# Patient Record
Sex: Female | Born: 1970 | Race: White | Hispanic: No | Marital: Married | Smoking: Never smoker
Health system: Southern US, Community
[De-identification: ages and names within clinical notes are randomized; demographics above are authoritative.]

## PROBLEM LIST (undated history)

## (undated) DIAGNOSIS — K5792 Diverticulitis of intestine, part unspecified, without perforation or abscess without bleeding: Secondary | ICD-10-CM

## (undated) DIAGNOSIS — K219 Gastro-esophageal reflux disease without esophagitis: Secondary | ICD-10-CM

## (undated) DIAGNOSIS — E119 Type 2 diabetes mellitus without complications: Secondary | ICD-10-CM

## (undated) DIAGNOSIS — E7800 Pure hypercholesterolemia, unspecified: Secondary | ICD-10-CM

## (undated) HISTORY — DX: Type 2 diabetes mellitus without complications: E11.9

## (undated) HISTORY — DX: Pure hypercholesterolemia, unspecified: E78.00

## (undated) HISTORY — DX: Diverticulitis of intestine, part unspecified, without perforation or abscess without bleeding: K57.92

## (undated) HISTORY — DX: Gastro-esophageal reflux disease without esophagitis: K21.9

---

## 2000-01-17 ENCOUNTER — Other Ambulatory Visit: Admission: RE | Admit: 2000-01-17 | Discharge: 2000-01-17 | Payer: Self-pay | Admitting: Obstetrics and Gynecology

## 2000-08-09 ENCOUNTER — Encounter (INDEPENDENT_AMBULATORY_CARE_PROVIDER_SITE_OTHER): Payer: Self-pay

## 2000-08-09 ENCOUNTER — Inpatient Hospital Stay (HOSPITAL_COMMUNITY): Admission: AD | Admit: 2000-08-09 | Discharge: 2000-08-12 | Payer: Self-pay | Admitting: Obstetrics and Gynecology

## 2000-09-12 ENCOUNTER — Other Ambulatory Visit: Admission: RE | Admit: 2000-09-12 | Discharge: 2000-09-12 | Payer: Self-pay | Admitting: Obstetrics and Gynecology

## 2001-11-16 ENCOUNTER — Other Ambulatory Visit: Admission: RE | Admit: 2001-11-16 | Discharge: 2001-11-16 | Payer: Self-pay | Admitting: Obstetrics and Gynecology

## 2002-12-18 ENCOUNTER — Other Ambulatory Visit: Admission: RE | Admit: 2002-12-18 | Discharge: 2002-12-18 | Payer: Self-pay | Admitting: Obstetrics and Gynecology

## 2004-01-02 ENCOUNTER — Inpatient Hospital Stay (HOSPITAL_COMMUNITY): Admission: AD | Admit: 2004-01-02 | Discharge: 2004-01-07 | Payer: Self-pay | Admitting: Obstetrics and Gynecology

## 2004-01-03 ENCOUNTER — Encounter (INDEPENDENT_AMBULATORY_CARE_PROVIDER_SITE_OTHER): Payer: Self-pay | Admitting: Specialist

## 2004-02-11 ENCOUNTER — Other Ambulatory Visit: Admission: RE | Admit: 2004-02-11 | Discharge: 2004-02-11 | Payer: Self-pay | Admitting: Obstetrics and Gynecology

## 2005-04-18 ENCOUNTER — Other Ambulatory Visit: Admission: RE | Admit: 2005-04-18 | Discharge: 2005-04-18 | Payer: Self-pay | Admitting: Obstetrics and Gynecology

## 2005-09-03 ENCOUNTER — Emergency Department (HOSPITAL_COMMUNITY): Admission: EM | Admit: 2005-09-03 | Discharge: 2005-09-03 | Payer: Self-pay | Admitting: *Deleted

## 2009-08-20 ENCOUNTER — Emergency Department (HOSPITAL_COMMUNITY): Admission: EM | Admit: 2009-08-20 | Discharge: 2009-08-20 | Payer: Self-pay | Admitting: Emergency Medicine

## 2010-12-29 LAB — POCT PREGNANCY, URINE: Preg Test, Ur: NEGATIVE

## 2010-12-29 LAB — POCT CARDIAC MARKERS
CKMB, poc: <1 ng/mL — ABNORMAL LOW (ref 1.0–8.0)
Myoglobin, poc: 26.9 ng/mL (ref 12–200)
Myoglobin, poc: 39.8 ng/mL (ref 12–200)

## 2011-02-11 NOTE — Op Note (Signed)
NAMESHAKARA, TWEEDY                        ACCOUNT NO.:  [id number]   MEDICAL RECORD NO.:  [id number]                   PATIENT TYPE:  INP   LOCATION:  9171                                 FACILITY:  [facility]   PHYSICIAN:  Guy Sandifer. Arleta Creek, M.D.           DATE OF BIRTH:  July 12, 1971   DATE OF PROCEDURE:  01/03/2004  DATE OF DISCHARGE:                                 OPERATIVE REPORT   PREOPERATIVE DIAGNOSES:  1. Intrauterine pregnancy at 28 weeks' estimated gestational age.  2. Preeclampsia.  3. Arrest of cervical dilation.   POSTOPERATIVE DIAGNOSES:  1. Intrauterine pregnancy at 72 weeks' estimated gestational age.  2. Preeclampsia.  3. Arrest of cervical dilation.   PROCEDURE:  Low transverse cesarean section.   SURGEON:  Guy Sandifer. Henderson Cloud, M.D.   ANESTHESIA:  Epidural, Raul Del, M.D.   ESTIMATED BLOOD LOSS:  800 mL.   SPECIMENS:  Placenta.   FINDINGS:  A viable female infant, Apgars of 8 and 9 at one and five minutes,  respectively.  Birth weight of 7 pounds 10 ounces.  Arterial cord pH 7.17.   INDICATIONS AND CONSENT:  This patient is a 40 year old married white  female, G2, P1, who was found to have blood pressures on admission of  140s/90s.  She had tenderness in the epigastrium on examination.  Laboratories revealed marked elevation of liver functions in the [phone]  range as well as elevated kidney functions.  Platelets remained normal and  blood pressures remained stable.  She was placed on Pitocin augmentation and  was given magnesium sulfate.  She progressed to 4 cm dilation.  She had no  progress beyond that despite good labor.  A diagnosis of arrest of dilation  was made and recommendation for cesarean section was made.  Potential risks  and complications were reviewed preoperatively, including but not limited to  infection, bowel, bladder, or ureteral damage, bleeding requiring  transfusion of blood products with possible transfusion reaction, HIV  and  hepatitis acquisition, DVT, PE, and pneumonia. All questions were answered  and consent is signed on the chart.   PROCEDURE:  The patient is taken to the operating room, where she is  identified, epidural anesthetic is augmented to a surgical level, and she is  placed in the supine position with a 15 degree left lateral wedge.  A Foley  catheter is already in place.  She is prepped and draped in a sterile  fashion.  After testing for adequate epidural anesthesia, the skin is  entered through a Pfannenstiel incision and dissection is carried out in  layers to the peritoneum.  The peritoneum is incised and extended superiorly  and inferiorly.  The vesicouterine peritoneum is incised a bladder flap is  developed ,and the bladder blade is placed.  The uterus is incised in a low  transverse fashion and the uterine cavity is entered bluntly with a  hemostat.  The uterine incision is  then extended cephalolaterally with the  fingers.  The vertex was then delivered.  The oropharynx and nasopharynx  were suctioned, and the remainder of the baby is delivered.  The cord is  clamped and cut and the baby is handed to the awaiting pediatrics team.  The  placenta is manually delivered and sent to pathology.  The uterine cavity is  normal in contour and clean.  The uterus is closed in a running locking  fashion with 0 Monocryl suture, which achieves good hemostasis.  There is a  bleeder on the bladder flap, which is controlled with figure-of-eight  Monocryl sutures.  Tubes and ovaries palpate normally.  Copious irrigation  is carried out.  Anterior rectus fascia is closed in a running fashion with  0 Monocryl suture, which is also used to reapproximate the pyramidalis  muscle in the midline.  The anterior rectus fascia is closed in a running  fashion with 0 PDS suture and the skin is closed with clips.  All sponge,  instrument, and needle counts are correct and the patient is transferred to  the  recovery room in stable condition.  She will be continued on postpartum  magnesium sulfate.                                               Guy Sandifer Arleta Creek, M.D.    JET/MEDQ  D:  01/03/2004  T:  01/05/2004  Job:  [id number]

## 2011-02-11 NOTE — Discharge Summary (Signed)
Mary Conley, Mary Conley                        ACCOUNT NO.:  [id number]   MEDICAL RECORD NO.:  [id number]                   PATIENT TYPE:  INP   LOCATION:  9131                                 [facility]ACILITY:  [facility]   PHYSICIAN:  Duke Salvia. Marcelle Overlie, M.D.            DATE OF BIRTH:  12/17/1970   DATE OF ADMISSION:  01/02/2004  DATE OF DISCHARGE:  01/07/2004                                 DISCHARGE SUMMARY   ADMISSION DIAGNOSES:  1. Intrauterine pregnancy at term.  2. Pre-eclampsia.   DISCHARGE DIAGNOSES:  1. Status post low transverse cesarean section secondary to arrest of     cervical dilatation.  2. Viable female infant.   PROCEDURE:  Primary low transverse cesarean section.   REASON FOR ADMISSION:  See written history and physical.   HOSPITAL COURSE:  The patient was a 40 year old white married female gravida  2, para 1 that was noted to have elevated blood pressure on admission of  140/90. The patient had tenderness in epigastrium on examination. Labs did  reveal marked elevation of liver function studies as well as elevated kidney  function. Platelets remained normal and blood pressure's remained stable.  The patient was then started on Pitocin to augment her labor. She was also  administered magnesium sulfate. The patient did progress to 4 cm dilated.  She did not progress beyond that despite good labor. A diagnosis of arrest  of dilatation was made and recommendation for cesarean section was made. The  patient was then transferred to the operating room where epidural was dosed  to an adequate surgical level. A low transverse incision was made with the  delivery of a viable female infant weighing 7 pounds 10 ounces with Apgar's of  8 at 1 minute and 9 at 5 minutes. Umbilical cord pH was 7.17. The patient  tolerated the procedure well and was taken to the recovery room in stable  condition. Magnesium sulfate continued and the patient was then transferred  to the AICU where she could  be monitored closely. Later that evening, the  patient was without complaint except for itch and blood pressure was 120/70  to 80's. Urine output was good. Lungs were clear to auscultation. Deep  tendon reflexes were 3+ without clonus. Abdomen was soft with sluggish bowel  sounds. Abdominal dressing was clean, dry and intact. Labs revealed  hemoglobin of 11.4, white count of 18.9, platelet count of 260,000. SGOT was  947. SGPT was 791. Creatinine was 1.3 and LDH was 769. The patient continued  on magnesium sulfate on postoperative day 2. The patient was alert with good  relief of pain medication. Blood pressure's continued to be 90's to 100 over  50's. Urine output was 200 to 300 cc per hour. There were a few crackles  noted in the lower bases of her lungs. She was 98% oxygen saturated. Abdomen  was soft. Abdominal dressing continued to be clean, dry and intact. Deep  tendon reflexes were 2+. However, the patient continued to remain on 2 grams  of magnesium sulfate per hour. Platelets remained 234,000. SGOT was 376.  SGPT was 349. Creatinine remained at 1.3. Uric acid was 6.6. Later that  evening, the patient complained of being tired with upper airway congestion.  She had had some increased coughing and wheezing in the afternoon. The  patient had been administered Lasix 10 mg, which had improved. Blood  pressure's remained 100 to 120's over 50 to 60. She was afebrile. Continued  to hear some crackles in the left base with decreased breath sounds in the  right base. Oxygen saturation was 94 to 96% on 3 liters per minute. The  patient was experiencing some good effort on incentive spirometry. She had  been up in the chair earlier that day. Abdomen was soft. Some mild  distention noted. Bowel sounds were heard but they were somewhat decreased.  The patient denied headache, visual disturbances. Lasix 20 mg was now  administered. On postoperative day 2, the patient did feel better. She   continued to deny headache or visual disturbance. Blood pressure 110/60.  Vital signs otherwise were stable. Lungs were now clear. Abdomen soft.  Fundus was firm. Incision was clean, dry and intact. SGOT was down to 180,  SGPT down to 201. LDH was 298. Platelets remained good at 282,000. The  patient continued on magnesium sulfate. On postoperative day 3, the patient  complained of some nasal stuffiness with blood tinged yellow mucous. She  also complained of some congestion with a non-productive cough. She denied  headache or visual disturbance of right upper quadrant pain. Vital signs  were stable. Blood pressure 108/72. Deep tendon reflexes 1 to 2+. Abdomen  was soft with good return of bowel function. Fundus was firm and non-tender.  Incision was clean, dry and intact. She was ambulating well. She was now  started on some Ceftin 250 mg b.i.d. for upper respiratory infection. On  postoperative day 4, the patient was without complaint. Vital signs were  stable. The fundus was firm and non-tender. The incision was clean, dry and  intact. Staples were removed.   DISPOSITION:  Discharge instructions were reviewed and the patient was  discharged home.   CONDITION ON DISCHARGE:  Stable.   DIET:  Regular as tolerated.   ACTIVITY:  No heavy listing, no driving x2 weeks, no vaginal entry.   FOLLOW UP:  The patient is to followup in the office in 1 week for an  incision check. She is to call for a temperature greater than 100 degrees,  persistent nausea and vomiting, heavy vaginal bleeding, and/or redness or  drainage from the incisional site. The patient was also instructed to call  for headache, visual disturbance or right upper quadrant pain.   DISCHARGE MEDICATIONS:  1. Tylox #30 1 p.o. q. 4 to 6 hours p.r.n.  2. Ceftin 250 mg 1 p.o. b.i.d.  3. Prenatal vitamins 1 p.o. daily.  4. Colace 1 p.o. daily p.r.n.    Julio Sicks, N.P.                        Richard M. Marcelle Overlie, M.D.     CC/MEDQ  D:  03/07/2004  T:  03/07/2004  Job:  [id number]

## 2011-02-11 NOTE — Op Note (Signed)
[facility]  Patient:    Mary Conley, Mary Conley                     MRN: [id number] Proc. Date: 08/10/00 Adm. Date:  [id number] Attending:  Madelyn Flavors                           Operative Report  PROCEDURE:                    Vacuum extraction.  INDICATIONS AND CONSENT:      The patient is a 40 year old, married white female, G1, P0, EDC of August 08, 2000, at 42-2/7 weeks.  Prenatal care is uncomplicated.  Group B strep culture negative.  The patient complained of uterine contractions.  Spontaneous rupture of membranes revealed moderate meconium.  Progressed to complete and pushing.  Pushing for approximately two hours.  Vertex, LOA, +3 station.  Good movement of vertex with a push.  Fetal heart tones reactive with early decelerations.  Options discussed with patient.  Vacuum extraction discussed with patient and husband.  Severe morbidity mortality of 1:40,000 discussed.  All questions answered.  DESCRIPTION OF PROCEDURE:     Bladder straight catheterized.  Kiwi extractor placed, while one popoff before adequate suction had been applied on the handle pump.  On subsequent contraction easy delivery of the vertex.  Nuchal cord x 2 noted.  DeLee suction of oronasopharynx was done.  The infant was then delivered.  The cord is rapidly clamped and cut and the infant is handed to awaiting pediatrics team.  Placenta is delivered intact.  Three-vessel cord noted.  Placenta sent to pathology.  Estimated blood loss 500 cc.  Cervix without laceration.  Second degree midline episiotomy with left sulcus extension approximately 3 to 4 cm noted.  This is repaired in a standard fashion.  A viable female infant, Apgars of 6 and 9 at one and five minutes is obtained.  Birth weight pending.  Arterial cord pH 7.31.  The patient and infant stable in labor and delivery room. DD:  08/10/00 TD:  08/10/00 Job: [id number]

## 2012-02-07 ENCOUNTER — Encounter: Payer: Self-pay | Admitting: *Deleted

## 2012-12-06 ENCOUNTER — Other Ambulatory Visit: Payer: Self-pay | Admitting: Family Medicine

## 2012-12-06 DIAGNOSIS — M545 Low back pain: Secondary | ICD-10-CM

## 2012-12-12 ENCOUNTER — Other Ambulatory Visit: Payer: Self-pay | Admitting: Family Medicine

## 2012-12-12 DIAGNOSIS — M545 Low back pain: Secondary | ICD-10-CM

## 2012-12-13 ENCOUNTER — Ambulatory Visit (HOSPITAL_COMMUNITY)
Admission: RE | Admit: 2012-12-13 | Discharge: 2012-12-13 | Disposition: A | Discharge status: Home / Self Care | Payer: BC Managed Care – PPO | Source: Ambulatory Visit | Attending: Family Medicine | Admitting: Family Medicine

## 2012-12-13 DIAGNOSIS — M545 Low back pain, unspecified: Secondary | ICD-10-CM | POA: Insufficient documentation

## 2012-12-24 ENCOUNTER — Encounter: Payer: Self-pay | Admitting: Family Medicine

## 2013-01-03 ENCOUNTER — Encounter: Payer: BC Managed Care – PPO | Admitting: Neurology

## 2013-09-17 ENCOUNTER — Ambulatory Visit (INDEPENDENT_AMBULATORY_CARE_PROVIDER_SITE_OTHER): Payer: BC Managed Care – PPO | Admitting: Family Medicine

## 2013-09-17 ENCOUNTER — Encounter: Payer: Self-pay | Admitting: Family Medicine

## 2013-09-17 VITALS — BP 120/72 | HR 76 | Temp 97.3°F | Resp 18 | Wt 225.0 lb

## 2013-09-17 DIAGNOSIS — J019 Acute sinusitis, unspecified: Secondary | ICD-10-CM

## 2013-09-17 MED ORDER — AMOXICILLIN-POT CLAVULANATE 875-125 MG PO TABS: 1.0000 | ORAL_TABLET | Freq: Two times a day (BID) | ORAL | Status: DC

## 2013-09-17 NOTE — Progress Notes (Signed)
   Subjective:    Patient ID: Mary Conley, female    DOB: November 10, 1970, 42 y.o.   MRN: [id number]  HPI Patient had an upper respiratory infection for the last 3 weeks. His characterized by sinus congestion, sinus pressure, postnasal drip, sore scratchy throat, nonproductive cough and subjective fevers. She reports headache and sinus pressure. She describes the cough as a tickle in the back of her throat.  She also reports pain in her upper teeth. She's tried over-the-counter medication for the last 3 weeks without benefit. Past Medical History  Diagnosis Date  . Chest pain   . GERD (gastroesophageal reflux disease)   . Hypercholesteremia    No current outpatient prescriptions on file prior to visit.   No current facility-administered medications on file prior to visit.   No Known Allergies History   Social History  . Marital Status: Married    Spouse Name: N/A    Number of Children: 2  . Years of Education: N/A   Occupational History  . teacher / coach    Social History Main Topics  . Smoking status: Never Smoker   . Smokeless tobacco: Not on file  . Alcohol Use: No  . Drug Use: Not on file  . Sexual Activity: Not on file   Other Topics Concern  . Not on file   Social History Narrative  . No narrative on file      Review of Systems  All other systems reviewed and are negative.       Objective:   Physical Exam  Vitals reviewed. Constitutional: She appears well-developed and well-nourished. No distress.  HENT:  Right Ear: External ear normal.  Left Ear: External ear normal.  Nose: Mucosal edema and rhinorrhea present. Right sinus exhibits maxillary sinus tenderness. Left sinus exhibits maxillary sinus tenderness.  Mouth/Throat: Oropharynx is clear and moist. No oropharyngeal exudate.  Eyes: Conjunctivae are normal.  Neck: Neck supple.  Cardiovascular: Normal rate, regular rhythm and normal heart sounds.   Pulmonary/Chest: Effort normal and breath sounds  normal. No respiratory distress. She has no wheezes. She has no rales.  Lymphadenopathy:    She has no cervical adenopathy.  Skin: She is not diaphoretic.          Assessment & Plan:  1. Acute rhinosinusitis Patient has originally sounds like a viral upper respiratory infection which has subsequently turned into a secondary bacterial sinusitis. Begin Augmentin 875 mg by mouth twice a day for 10 days. Begin Sudafed 60 mg by mouth every 4 hours as needed for congestion. Begin nasal saline 4 times a day. Recheck in one week if no better or sooner if worse. - amoxicillin-clavulanate (AUGMENTIN) 875-125 MG per tablet; Take 1 tablet by mouth 2 (two) times daily.  Dispense: 20 tablet; Refill: 0

## 2013-09-24 ENCOUNTER — Telehealth: Payer: Self-pay | Admitting: Family Medicine

## 2013-09-24 NOTE — Telephone Encounter (Signed)
Pt is calling to see if she needs to have an apt with dr pickard or can she just come in to do her blood work to check her vitamin D that she was suppose to have already done. Also she wants to know if she can pee in a cup to see if she has a UTI Call back number is [phone]

## 2013-09-25 NOTE — Telephone Encounter (Signed)
Pt will need an appt and due to office being closed pt will need to been seen at [facility] for UTI.  Pt aware per vm.

## 2013-09-27 ENCOUNTER — Other Ambulatory Visit: Payer: BC Managed Care – PPO

## 2013-09-27 DIAGNOSIS — E559 Vitamin D deficiency, unspecified: Secondary | ICD-10-CM

## 2013-09-28 LAB — VITAMIN D 25 HYDROXY (VIT D DEFICIENCY, FRACTURES): VIT D 25 HYDROXY: 33 ng/mL (ref 30–89)

## 2013-09-30 ENCOUNTER — Encounter: Payer: Self-pay | Admitting: Family Medicine

## 2013-09-30 NOTE — Telephone Encounter (Signed)
Pt is calling to see about the results of her blood work Call back number is [phone]

## 2013-10-04 NOTE — Telephone Encounter (Signed)
This encounter was created in error - please disregard.

## 2014-05-06 ENCOUNTER — Encounter: Payer: Self-pay | Admitting: Family Medicine

## 2014-05-06 ENCOUNTER — Ambulatory Visit (INDEPENDENT_AMBULATORY_CARE_PROVIDER_SITE_OTHER): Payer: BC Managed Care – PPO | Admitting: Family Medicine

## 2014-05-06 VITALS — BP 120/72 | HR 80 | Resp 18 | Wt 228.0 lb

## 2014-05-06 DIAGNOSIS — R300 Dysuria: Secondary | ICD-10-CM

## 2014-05-06 DIAGNOSIS — M545 Low back pain, unspecified: Secondary | ICD-10-CM

## 2014-05-06 DIAGNOSIS — R739 Hyperglycemia, unspecified: Secondary | ICD-10-CM

## 2014-05-06 DIAGNOSIS — R7309 Other abnormal glucose: Secondary | ICD-10-CM

## 2014-05-06 LAB — URINALYSIS, ROUTINE W REFLEX MICROSCOPIC
Bilirubin Urine: NEGATIVE
Glucose, UA: NEGATIVE mg/dL
Ketones, ur: NEGATIVE mg/dL
NITRITE: NEGATIVE
Protein, ur: NEGATIVE mg/dL
SPECIFIC GRAVITY, URINE: 1.025 (ref 1.005–1.030)
UROBILINOGEN UA: 0.2 mg/dL (ref 0.0–1.0)
pH: 5 (ref 5.0–8.0)

## 2014-05-06 LAB — COMPLETE METABOLIC PANEL WITH GFR
ALBUMIN: 4 g/dL (ref 3.5–5.2)
ALK PHOS: 39 U/L (ref 39–117)
ALT: 15 U/L (ref 0–35)
AST: 13 U/L (ref 0–37)
BUN: 9 mg/dL (ref 6–23)
CO2: 25 mEq/L (ref 19–32)
CREATININE: 0.66 mg/dL (ref 0.50–1.10)
Calcium: 8.7 mg/dL (ref 8.4–10.5)
Chloride: 101 mEq/L (ref 96–112)
GFR, Est African American: >89 mL/min
GFR, Est Non African American: >89 mL/min
Glucose, Bld: 110 mg/dL — ABNORMAL HIGH (ref 70–99)
POTASSIUM: 4.1 meq/L (ref 3.5–5.3)
Sodium: 135 mEq/L (ref 135–145)
Total Bilirubin: 0.4 mg/dL (ref 0.2–1.2)
Total Protein: 6.8 g/dL (ref 6.0–8.3)

## 2014-05-06 LAB — URINALYSIS, MICROSCOPIC ONLY
CRYSTALS: NONE SEEN
Casts: NONE SEEN

## 2014-05-06 LAB — HEMOGLOBIN A1C
HEMOGLOBIN A1C: 5.9 % — AB (ref <5.7)
MEAN PLASMA GLUCOSE: 123 mg/dL — AB (ref <117)

## 2014-05-06 MED ORDER — CIPROFLOXACIN HCL 500 MG PO TABS: 500.0000 mg | ORAL_TABLET | Freq: Two times a day (BID) | ORAL | Status: DC

## 2014-05-06 NOTE — Addendum Note (Signed)
Addended by: WRAY, [place] on: 05/06/2014 10:30 AM   Modules accepted: Orders

## 2014-05-06 NOTE — Progress Notes (Signed)
   Subjective:    Patient ID: Mary Conley, female    DOB: January 07, 1971, 43 y.o.   MRN: [id number]  HPI I saw the patient for low back pain in March 2014. At that time, I obtained a lumbar spine x-ray. The results are as follows: Findings: Mild levoscoliosis lumbar spine. No significant disc  space narrowing. No pars defect.   I recommended physical therapy and recheck in one month.  Patient was doing well over the last year with no complaints. Recently 2 weeks ago, she developed low back pain located over the sacrum. There is no radiation of the pain down her legs her upper spine. She denies any lumbar radiculopathy. She also complains that dysuria, cloudy foul-smelling urine, and pelvic pressure and discomfort. This has improved and she started drinking cranberry juice. She denies any fevers or chills or nausea or vomiting. She does report fasting blood sugars between 110 and 126. She is drinking sodas and cheese every day. She is not adhering to a low carb diet. Past Medical History  Diagnosis Date  . Chest pain   . GERD (gastroesophageal reflux disease)   . Hypercholesteremia    No current outpatient prescriptions on file prior to visit.   No current facility-administered medications on file prior to visit.   No Known Allergies History   Social History  . Marital Status: Married    Spouse Name: N/A    Number of Children: 2  . Years of Education: N/A   Occupational History  . teacher / coach    Social History Main Topics  . Smoking status: Never Smoker   . Smokeless tobacco: Not on file  . Alcohol Use: No  . Drug Use: Not on file  . Sexual Activity: Not on file   Other Topics Concern  . Not on file   Social History Narrative  . No narrative on file      Review of Systems  All other systems reviewed and are negative.      Objective:   Physical Exam  Vitals reviewed. Cardiovascular: Normal rate, regular rhythm and normal heart sounds.   Pulmonary/Chest:  Effort normal and breath sounds normal.  Abdominal: Soft. Bowel sounds are normal. She exhibits no distension. There is no tenderness. There is no rebound and no guarding.  Musculoskeletal:       Lumbar back: She exhibits pain. She exhibits normal range of motion, no bony tenderness and no spasm.          Assessment & Plan:  1. Bilateral low back pain without sciatica With the back pain is likely muscular in nature. I did not believe it is related to urinary tract infection. I recommended tincture of time in the next one to 2 weeks to see if symptoms improve. 2. Hyperglycemia The patient is developing prediabetes. I will check hemoglobin A1c. I recommended abstinence from soda and juice. I recommended a low carbohydrate diet, regular aerobic exercise, and 10:15 pounds weight - COMPLETE METABOLIC PANEL WITH GFR - Hemoglobin A1c  3. Dysuria Urinalysis is complicated by the fact that patient is on her period. However there is leukocyte esterase and the patient is having symptoms consistent with a bladder infection therefore I will treat her with Cipro 500 mg by mouth twice a day for 3 days. - Urinalysis, Routine w reflex microscopic - ciprofloxacin (CIPRO) 500 MG tablet; Take 1 tablet (500 mg total) by mouth 2 (two) times daily.  Dispense: 6 tablet; Refill: 0

## 2014-05-07 LAB — URINE CULTURE
Colony Count: NO GROWTH
ORGANISM ID, BACTERIA: NO GROWTH

## 2014-05-08 ENCOUNTER — Telehealth: Payer: Self-pay | Admitting: Family Medicine

## 2014-05-08 NOTE — Telephone Encounter (Signed)
Patient is calling to get her lab results  [phone]

## 2014-05-09 NOTE — Telephone Encounter (Signed)
Call placed to patient and patient made aware.  

## 2014-06-24 ENCOUNTER — Other Ambulatory Visit: Payer: Self-pay | Admitting: Obstetrics and Gynecology

## 2014-09-04 ENCOUNTER — Other Ambulatory Visit: Payer: Self-pay | Admitting: Obstetrics and Gynecology

## 2014-10-23 ENCOUNTER — Encounter: Payer: Self-pay | Admitting: Physician Assistant

## 2014-10-23 ENCOUNTER — Ambulatory Visit (INDEPENDENT_AMBULATORY_CARE_PROVIDER_SITE_OTHER): Payer: BC Managed Care – PPO | Admitting: Physician Assistant

## 2014-10-23 VITALS — BP 114/60 | HR 68 | Temp 98.1°F | Resp 20 | Wt 226.0 lb

## 2014-10-23 DIAGNOSIS — L239 Allergic contact dermatitis, unspecified cause: Secondary | ICD-10-CM

## 2014-10-23 DIAGNOSIS — L200 Besnier's prurigo: Secondary | ICD-10-CM

## 2014-10-23 MED ORDER — PREDNISONE 20 MG PO TABS
ORAL_TABLET | ORAL | Status: DC
Start: 2014-10-23 — End: 2015-03-31

## 2014-10-23 NOTE — Progress Notes (Signed)
    Patient ID: Mary Conley MRN: [id number], DOB: [birth year], 44 y.o. Date of Encounter: 10/23/2014, 4:55 PM    Chief Complaint:  Chief Complaint  Patient presents with  . rash    back of arms, thighs, feet     HPI: 44 y.o. year old white female says that itching and rash started on the night of Tuesday 10/21/14. Says at that time started on the backs of her upper arms on the right arm then to the left arm. Since then she has also developed some itchy rash on her thighs bilaterally. Also her feet feel very itchy but there is only a small amount of mottled rash around the edges of her feet. No itching or rash on the chest or abdomen or back. Says that she has had no change in detergents or lotions soaps etc. cannot think of anything she has come in contact with.     Home Meds:   Outpatient Prescriptions Prior to Visit  Medication Sig Dispense Refill  . Probiotic Product (PROBIOTIC PO) Take by mouth. Takes over the counter    . ciprofloxacin (CIPRO) 500 MG tablet Take 1 tablet (500 mg total) by mouth 2 (two) times daily. 6 tablet 0   No facility-administered medications prior to visit.    Allergies: No Known Allergies    Review of Systems: See HPI for pertinent ROS. All other ROS negative.    Physical Exam: Blood pressure 114/60, pulse 68, temperature 98.1 F (36.7 C), temperature source Oral, resp. rate 20, weight 226 lb (102.513 kg)., There is no height on file to calculate BMI. General:  Obese white female . Appears in no acute distress. Neck: Supple. No thyromegaly. No lymphadenopathy. Lungs: Clear bilaterally to auscultation without wheezes, rales, or rhonchi. Breathing is unlabored. Heart: Regular rhythm. No murmurs, rubs, or gallops. Msk:  Strength and tone normal for age. Extremities/Skin: Very small, tiny pink papules on upper arms bilaterally and on anterior thighs bilaterally. Very light pink mottled macular rash around periphery of feet.  Neuro: Alert and  oriented X 3. Moves all extremities spontaneously. Gait is normal. CNII-XII grossly in tact. Psych:  Responds to questions appropriately with a normal affect.     ASSESSMENT AND PLAN:  44 y.o. year old female with  1. Allergic dermatitis - predniSONE (DELTASONE) 20 MG tablet; Take 3 daily for 2 days, then 2 daily for 2 days, then 1 daily for 2 days.  Dispense: 12 tablet; Refill: 0 She is to take prednisone taper as directed. She says the Benadryl makes her very drowsy but she is to take this at night. Can apply hydrocortisone cream for itching until the prednisone begins to take effect. Follow-up if itching and rash do not resolve over the next few days or at completion of prednisone.  [address] [place], [place], [facility] 10/23/2014 4:55 PM

## 2015-01-19 ENCOUNTER — Telehealth: Payer: Self-pay | Admitting: Family Medicine

## 2015-01-19 NOTE — Telephone Encounter (Signed)
Patient is calling to see if dr pickard will refill her Vitamin d pill.  [phone]

## 2015-01-20 NOTE — Telephone Encounter (Signed)
LMTRC

## 2015-01-28 NOTE — Telephone Encounter (Signed)
LMTRC

## 2015-02-04 NOTE — Telephone Encounter (Signed)
Last Vit D was low normal and should be taking OTC vit D 1000 units qd. Would need ov and labs to be restarted on vit d 5000. Unable to reach pt by phone.

## 2015-03-25 ENCOUNTER — Telehealth: Payer: Self-pay | Admitting: Family Medicine

## 2015-03-25 NOTE — Telephone Encounter (Signed)
Okay to send Vitamin D 50,000 IU x 12 weeks, then needs 1000IU daily Her A1C is trending up, she needs to work on weight loss and nutrition, see if willing to go to nutritionist. I would recheck in 3 months if not improved  May need to start Metformin

## 2015-03-25 NOTE — Telephone Encounter (Signed)
Pt brought labs in that were done at her workplace and her Vitamin D was low - 18.4 (lab work is on my desk) and she is wondering if she needs to take the 50,000 U of vit D again?? Her BS was also a little high at 128 fasting and A1C was 6.3.

## 2015-03-31 ENCOUNTER — Ambulatory Visit (INDEPENDENT_AMBULATORY_CARE_PROVIDER_SITE_OTHER): Payer: BC Managed Care – PPO | Admitting: Family Medicine

## 2015-03-31 ENCOUNTER — Ambulatory Visit (HOSPITAL_COMMUNITY)
Admission: RE | Admit: 2015-03-31 | Discharge: 2015-03-31 | Disposition: A | Discharge status: Home / Self Care | Payer: BC Managed Care – PPO | Source: Ambulatory Visit | Attending: Family Medicine | Admitting: Family Medicine

## 2015-03-31 ENCOUNTER — Encounter: Payer: Self-pay | Admitting: Family Medicine

## 2015-03-31 VITALS — BP 118/64 | HR 78 | Temp 98.7°F | Resp 18 | Wt 225.0 lb

## 2015-03-31 DIAGNOSIS — R7309 Other abnormal glucose: Secondary | ICD-10-CM | POA: Diagnosis not present

## 2015-03-31 DIAGNOSIS — G8929 Other chronic pain: Secondary | ICD-10-CM

## 2015-03-31 DIAGNOSIS — E65 Localized adiposity: Secondary | ICD-10-CM

## 2015-03-31 DIAGNOSIS — M542 Cervicalgia: Secondary | ICD-10-CM

## 2015-03-31 DIAGNOSIS — R7303 Prediabetes: Secondary | ICD-10-CM

## 2015-03-31 MED ORDER — VITAMIN D (ERGOCALCIFEROL) 1.25 MG (50000 UNIT) PO CAPS
50000.0000 [IU] | ORAL_CAPSULE | ORAL | Status: DC
Start: 2015-03-31 — End: 2018-05-07

## 2015-03-31 NOTE — Telephone Encounter (Signed)
Pt aware and will work on diet and exercise as she has started a new program and f/u in 3 months - Med sent to [employer]

## 2015-03-31 NOTE — Progress Notes (Signed)
Subjective:    Patient ID: Mary Conley, female    DOB: 12/16/1970, 44 y.o.   MRN: [id number]  HPI Patient recently had lab work in [facility] revealed a fasting blood sugar of 128 and hemoglobin A1c of 6.3 consistent with prediabetes. She also has obesity and a large waist circumference. She is borderline metabolic syndrome although she does not reach lipid guidelines normal blood pressure guidelines. She is concerned because of some chronic pain in her neck as well as a buffalo hump that is developed around the level of C7. Certainly she has physical exam findings concerning for Cushing syndrome. However I believe that this is just a fat pad on her posterior neck likely related to her metabolic syndrome. She also complains of pain and stiffness in her hands in the morning and improves throughout the day. Her hands also occasionally go numb at night distal to the wrist but they improve with position changes and extending her wrist. She does a lot of typing at work Past Medical History  Diagnosis Date  . Chest pain   . GERD (gastroesophageal reflux disease)   . Hypercholesteremia    Past Surgical History  Procedure Laterality Date  . Cesarean section     Current Outpatient Prescriptions on File Prior to Visit  Medication Sig Dispense Refill  . Vitamin D, Ergocalciferol, (DRISDOL) 50000 UNITS CAPS capsule Take 1 capsule (50,000 Units total) by mouth every 7 (seven) days. 4 capsule 3   No current facility-administered medications on file prior to visit.   No Known Allergies History   Social History  . Marital Status: Married    Spouse Name: N/A  . Number of Children: 2  . Years of Education: N/A   Occupational History  . teacher / coach    Social History Main Topics  . Smoking status: Never Smoker   . Smokeless tobacco: Not on file  . Alcohol Use: No  . Drug Use: Not on file  . Sexual Activity: Not on file   Other Topics Concern  . Not on file   Social History  Narrative      Review of Systems  All other systems reviewed and are negative.      Objective:   Physical Exam  HENT:  Mouth/Throat: No oropharyngeal exudate.  Neck: Neck supple. No JVD present. No thyromegaly present.  Cardiovascular: Normal rate, regular rhythm and normal heart sounds.   Pulmonary/Chest: Effort normal and breath sounds normal.  Musculoskeletal:       Cervical back: She exhibits decreased range of motion, tenderness, deformity and pain. She exhibits no bony tenderness.       Right hand: Normal.       Left hand: Normal.  Lymphadenopathy:    She has no cervical adenopathy.  Vitals reviewed. Patient has a small buffalo hump over the C7 spinous process        Assessment & Plan:  Chronic neck pain - Plan: DG Cervical Spine Complete  Buffalo hump - Plan: Cortisol-pm, blood  Prediabetes  Patient has prediabetes. She is also borderline for metabolic syndrome. I believe the buffalo hump is likely just deposition of fat around the spinous processes of the cervical spine. However I will check a cortisol level to rule out Cushing's disease particularly given her prediabetes. I will also obtain an x-ray of the neck given the pain she is having the neck. I believe the pain and stiffness in her neck is likely due to muscle spasms. I recommended stretching  and massages to help treat this. I do believe she is developing some early arthritis in her hands to account for the stiffness in the morning. Also believe she may have some mild carpal tunnel syndrome causing the numbness at night. If cortisol level is normal and the x-ray of the neck is normal, I have recommended diet exercise and weight loss as a means to manage her prediabetes. Also recommended stretching, massages, and exercises treat the pain in her neck. I anticipate that the buffalo hump likely regress with weight loss. Certainly if the cortisol level is elevated, I will refer the patient to an endocrinologist. If  there is evidence of degenerative disc disease in the neck, I will treat the patient with anti-inflammatory.

## 2015-04-01 LAB — CORTISOL-PM, BLOOD: Cortisol - PM: 18.5 ug/dL — ABNORMAL HIGH (ref 3.1–16.7)

## 2015-04-02 ENCOUNTER — Other Ambulatory Visit: Payer: Self-pay | Admitting: Family Medicine

## 2015-04-02 DIAGNOSIS — E270 Other adrenocortical overactivity: Principal | ICD-10-CM

## 2015-04-02 DIAGNOSIS — R7989 Other specified abnormal findings of blood chemistry: Secondary | ICD-10-CM

## 2015-04-03 ENCOUNTER — Other Ambulatory Visit: Payer: BC Managed Care – PPO

## 2015-04-03 DIAGNOSIS — R7989 Other specified abnormal findings of blood chemistry: Secondary | ICD-10-CM

## 2015-04-03 DIAGNOSIS — E270 Other adrenocortical overactivity: Principal | ICD-10-CM

## 2015-04-07 LAB — CORTISOL, URINE, 24 HOUR
CORTISOL (UR), FREE: 11.9 ug/(24.h) (ref 4.0–50.0)
RESULTS RECEIVED: 1.39 g/(24.h) (ref 0.63–2.50)

## 2015-07-01 ENCOUNTER — Encounter: Payer: Self-pay | Admitting: Physician Assistant

## 2015-07-01 ENCOUNTER — Ambulatory Visit (INDEPENDENT_AMBULATORY_CARE_PROVIDER_SITE_OTHER): Payer: BC Managed Care – PPO | Admitting: Physician Assistant

## 2015-07-01 VITALS — BP 114/80 | HR 88 | Temp 98.6°F | Resp 20 | Wt 226.0 lb

## 2015-07-01 DIAGNOSIS — M25561 Pain in right knee: Secondary | ICD-10-CM | POA: Diagnosis not present

## 2015-07-01 DIAGNOSIS — J392 Other diseases of pharynx: Secondary | ICD-10-CM | POA: Diagnosis not present

## 2015-07-01 DIAGNOSIS — Z23 Encounter for immunization: Secondary | ICD-10-CM

## 2015-07-01 DIAGNOSIS — M545 Low back pain, unspecified: Secondary | ICD-10-CM

## 2015-07-01 DIAGNOSIS — R300 Dysuria: Secondary | ICD-10-CM

## 2015-07-01 LAB — URINALYSIS, ROUTINE W REFLEX MICROSCOPIC
BILIRUBIN URINE: NEGATIVE
GLUCOSE, UA: NEGATIVE
HGB URINE DIPSTICK: NEGATIVE
Ketones, ur: NEGATIVE
Leukocytes, UA: NEGATIVE
Nitrite: NEGATIVE
PROTEIN: NEGATIVE
Specific Gravity, Urine: 1.025 (ref 1.001–1.035)
pH: 6.5 (ref 5.0–8.0)

## 2015-07-01 NOTE — Progress Notes (Signed)
Patient ID: GRAINNE KNIGHTS MRN: [id number], DOB: 1971/06/22, 44 y.o. Date of Encounter: 07/01/2015, 1:19 PM    Chief Complaint:  Chief Complaint  Patient presents with  . c/o lower back flank pain    urinary leakage  . feels like something stuck in throat     HPI: 44 y.o. year old white female presents with the following issues:  Says that yesterday afternoon she felt some discomfort in her left low back. States that last night when sleeping, when she was in certain positions she felt uncomfortable in her left low back, but then with other position changes back feel better without that discomfort there. Wasn't sure if this was muscular or may be from a urine infections wanted to check for urine infection.  States that Saturday 06/20/15 which was 10 days ago, she was eating some kettle corn. Felt a kernel of corn get stuck in her throat. Says that she started feeling this sensation right then and has continued to feel this sensation since then. says that she has tried drinking lots of fluid but that does not get rid of it. Says she has even stuck her toothbrush to the back of her throat to try to get herself to gag--was hoping that would help, but it hasn't. Says that she does not have a "sore throat "and has no rhinorrhea or mucus from her nose to suggest any issues with nasal drainage. Does not think that this is coming from irritation secondary to postnasal drip. Says that she still is feeling exact same discomfort that she felt when eating that kettle corn and sensation has been there ever since the kettle corn.  Says she has also been having problems with her right knee recently. Says that when it started, she was only feeling it sometimes at night. Says that in the middle of the night she would feel pain in her right knee. At that time the knee would be bent and when she would try to stretch it out to straighten it, it would hurt really bad while trying to straighten it. However when she  got it straightened out,  then the knee would feel okay. Was only feeling this at night and was having no problems with her knee during the day until recently. Now over the past week even during the day, sometimes the right knee 'feels like it is burning inside". Has done no unusual activity. Has had no injury or trauma. Knee has not felt like it was giving way or locking up.     Home Meds:   Outpatient Prescriptions Prior to Visit  Medication Sig Dispense Refill  . Vitamin D, Ergocalciferol, (DRISDOL) 50000 UNITS CAPS capsule Take 1 capsule (50,000 Units total) by mouth every 7 (seven) days. 4 capsule 3   No facility-administered medications prior to visit.    Allergies: No Known Allergies    Review of Systems: See HPI for pertinent ROS. All other ROS negative.    Physical Exam: Blood pressure 114/80, pulse 88, temperature 98.6 F (37 C), temperature source Oral, resp. rate 20, weight 226 lb (102.513 kg)., There is no height on file to calculate BMI. General:  Obese WF. Appears in no acute distress. HEENT: Normocephalic, atraumatic, eyes without discharge, sclera non-icteric, nares are without discharge. Oral cavity moist, posterior pharynx without exudate, erythema, peritonsillar abscess, or post nasal drip. No foreign object visualized in throat. No lesion, erythema, etc visulaized in throat.   Neck: Supple. No thyromegaly. No lymphadenopathy. Lungs: Clear  bilaterally to auscultation without wheezes, rales, or rhonchi. Breathing is unlabored. Heart: Regular rhythm. No murmurs, rubs, or gallops. Msk:  Strength and tone normal for age. Left low back: Minimal tenderness with palpation of area approximate L1-L3. No tenderness with percussion of costophrenic angles bilaterally. Right knee: Inspection is normal. With palpation there is tenderness with palpation along the medial joint line. No tenderness with palpation around the patella or over the lateral joint line. Grind test  negative. Extremities/Skin: Warm and dry. Neuro: Alert and oriented X 3. Moves all extremities spontaneously. Gait is normal. CNII-XII grossly in tact. Psych:  Responds to questions appropriately with a normal affect.   Results for orders placed or performed in visit on 07/01/15  Urinalysis, Routine w reflex microscopic (not at [facility])  Result Value Ref Range   Color, Urine YELLOW YELLOW   APPearance CLEAR CLEAR   Specific Gravity, Urine 1.025 1.001 - 1.035   pH 6.5 5.0 - 8.0   Glucose, UA NEGATIVE NEGATIVE   Bilirubin Urine NEGATIVE NEGATIVE   Ketones, ur NEGATIVE NEGATIVE   Hgb urine dipstick NEGATIVE NEGATIVE   Protein, ur NEGATIVE NEGATIVE   Nitrite NEGATIVE NEGATIVE   Leukocytes, UA NEGATIVE NEGATIVE     ASSESSMENT AND PLAN:  44 y.o. year old female with  1. Left-sided low back pain without sciatica Urinalysis is normal and does not indicate UTI. Discussed this with patient and discussed that her back pain sounds consistent with muscular back pain. Offered prescription for muscle relaxer but she defers. She continues over-the-counter NSAIDs with food as needed. Also recommend gentle stretching and also can apply heat to the area.  2. Dysuria - Urinalysis, Routine w reflex microscopic (not at [facility])  3. Throat irritation Exam here is normal. Discussed that if this does not resolve then she would need to see ENT for them to further evaluate. She does not want referral to ENT at this time. Says that she will monitor for another week and then if it does not resolve he will call and I will do referral at that time.  4. Right knee pain She is to take anti-inflammatory with food on a regular basis for one week. She is to ice the knee at night. Follow-up if symptoms do not improve with this treatment.  5. Need for prophylactic vaccination and inoculation against influenza - Flu Vaccine QUAD 36+ mos IM   Signed, [address] [place], [place], [facility] 07/01/2015 1:19 PM

## 2016-02-11 ENCOUNTER — Other Ambulatory Visit: Payer: Self-pay | Admitting: Obstetrics and Gynecology

## 2016-02-11 DIAGNOSIS — N644 Mastodynia: Secondary | ICD-10-CM

## 2016-02-18 ENCOUNTER — Ambulatory Visit
Admission: RE | Admit: 2016-02-18 | Discharge: 2016-02-18 | Disposition: A | Discharge status: Home / Self Care | Payer: BC Managed Care – PPO | Source: Ambulatory Visit | Attending: Obstetrics and Gynecology | Admitting: Obstetrics and Gynecology

## 2016-02-18 DIAGNOSIS — N644 Mastodynia: Secondary | ICD-10-CM

## 2016-11-15 ENCOUNTER — Telehealth: Payer: Self-pay

## 2016-11-15 NOTE — Telephone Encounter (Signed)
Called pt because we received  a letter  stating pt needed and eye exam because she is a diabetic patient.  When I asked pt If she was on diabetic meidication she stated she was taking metformin. Pt states she was diagnosed by another dr who did lab work   Pt states she is seeing an Associate Professor and they are monitoring her diabetes and she has had lab work with them recently.Pt also states she will call her eye dr to sch an eye exam

## 2016-12-09 ENCOUNTER — Telehealth: Payer: Self-pay

## 2016-12-09 NOTE — Telephone Encounter (Signed)
Called LVM for patient to return my call in reference to an eye exam that should have been done in the last 12 months due to being a diabetic patient.

## 2016-12-16 NOTE — Telephone Encounter (Signed)
I left message for pt.  We have not seen her since 06/2015.  Diabetes is not on her problem list.  Pt told this and advised to make appt here to be seen as she was concerned about blood sugars for those visits.  If is diabetic, yes , does need diab eye exam.  She is free to schedule that on her own.  We can do, but must be seen here first as we have not seen her in a long time.

## 2017-05-30 IMAGING — DX DG CERVICAL SPINE COMPLETE 4+V
5 series · 5 of 5 positions shown · non-contrast
Comparison: None.

CLINICAL DATA: Posterior neck pain for 1 month, no known injury

EXAM:
CERVICAL SPINE  4+ VIEWS

[c-spine lat]
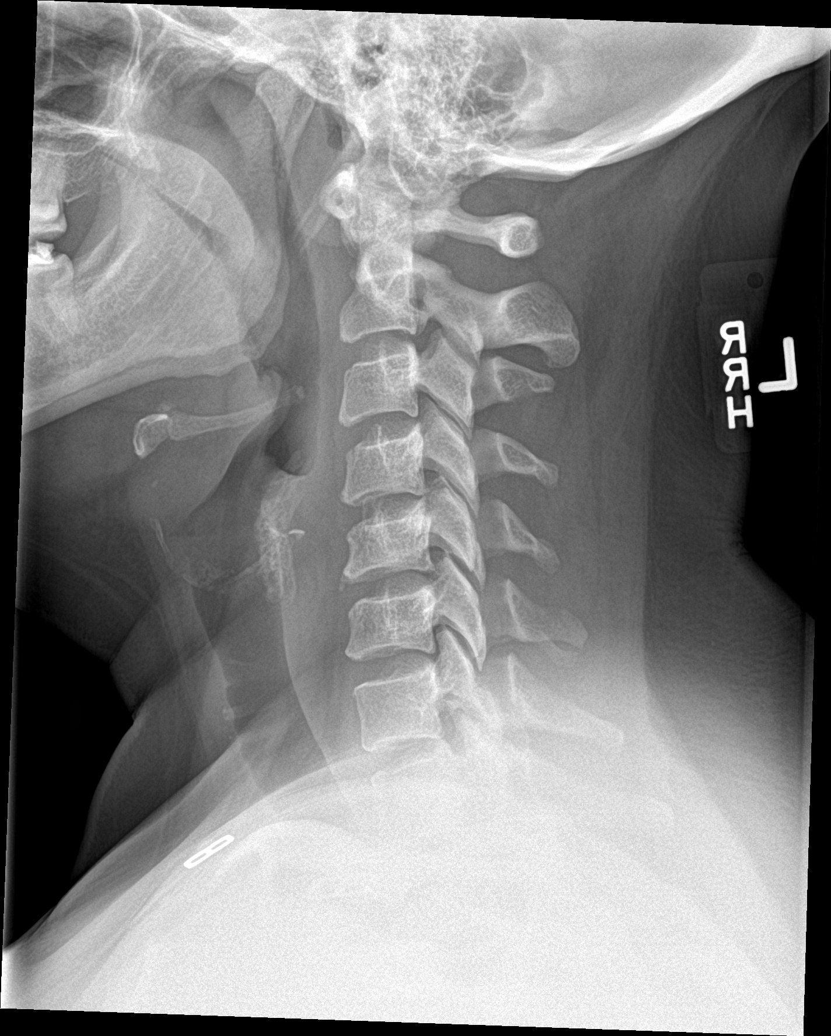

[c-spine obl (1 of 2)]
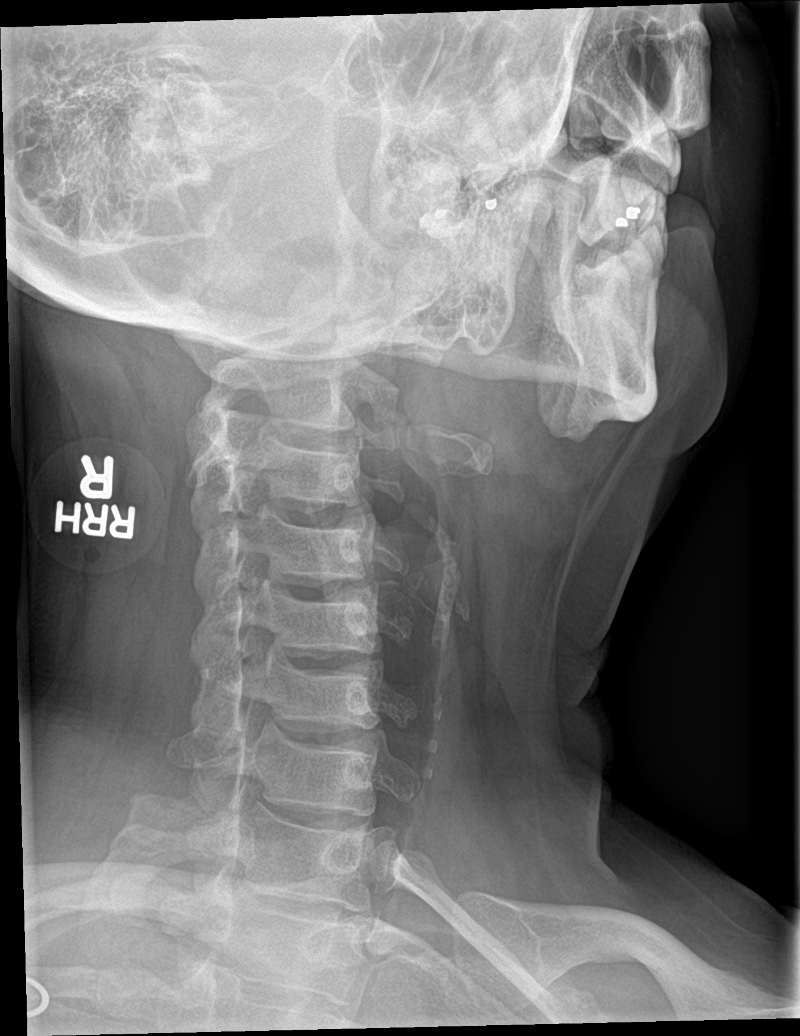

[c-spine obl (2 of 2)]
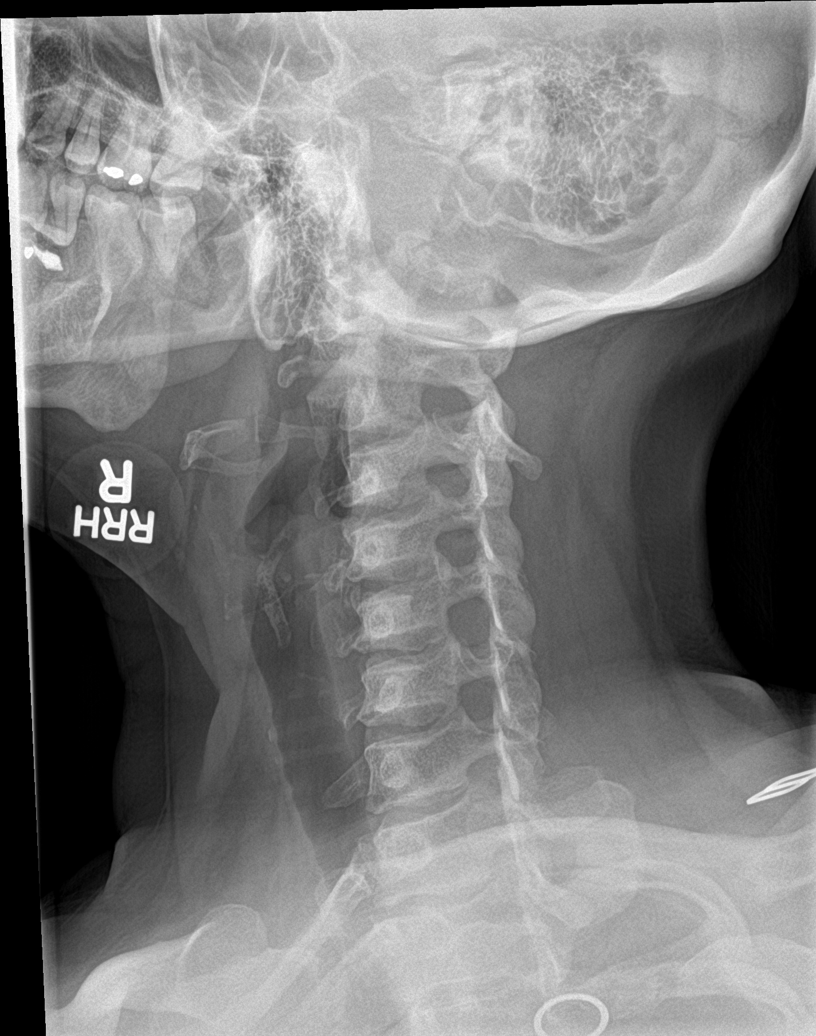

[c-spine ap]
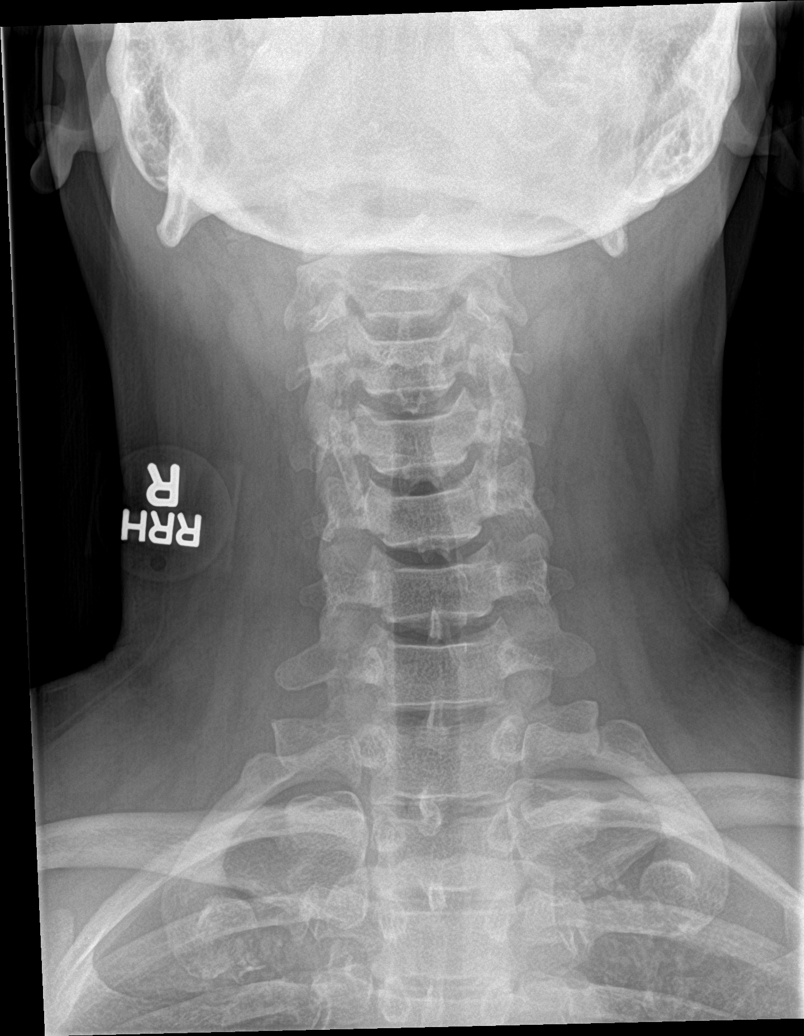

[c-spine open mouth]
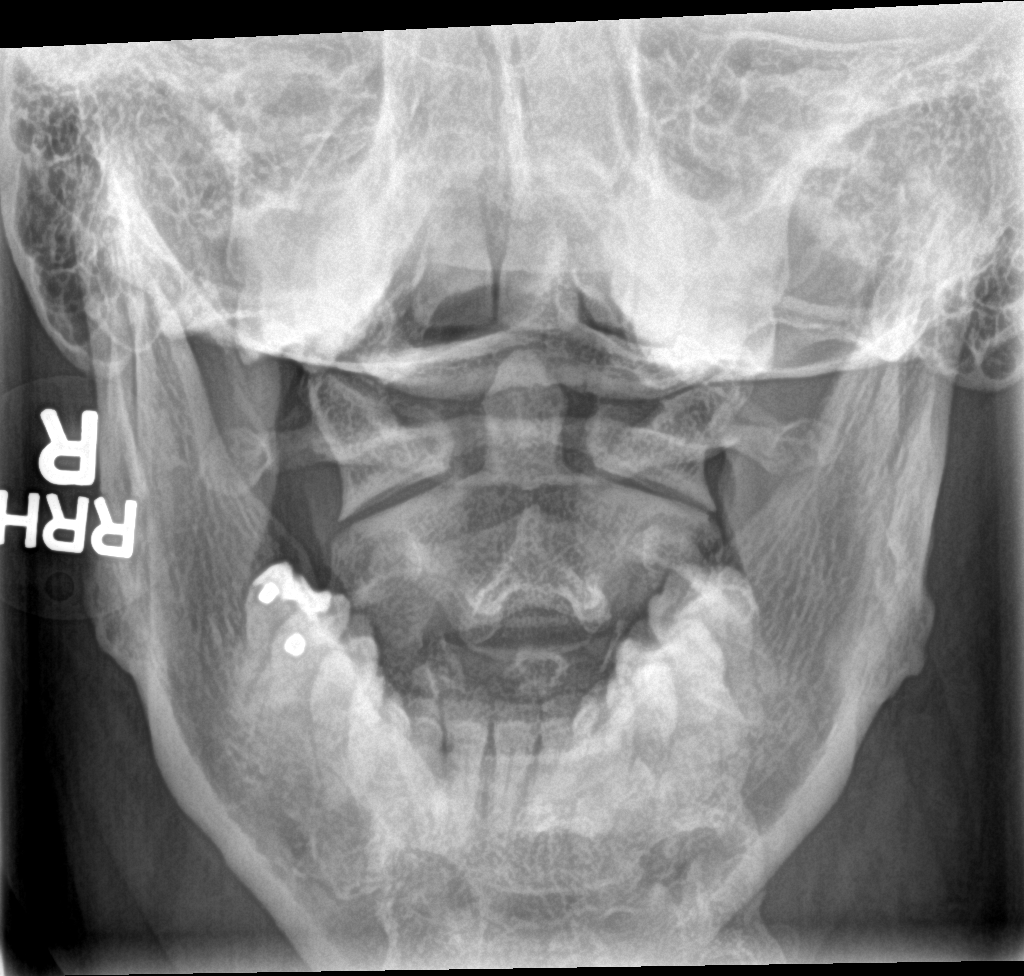

[5 of 5 positions shown; findings below may reference images not displayed]

FINDINGS: The cervical vertebrae are straightened in alignment. Intervertebral
disc spaces appear normal. No prevertebral soft tissue swelling is
seen. On oblique views, the foramina are patent. The odontoid
process is intact. The lung apices are clear.
IMPRESSION: Straightened alignment. Normal intervertebral disc spaces. No
foraminal narrowing.

## 2018-05-07 ENCOUNTER — Other Ambulatory Visit: Payer: Self-pay

## 2018-05-07 ENCOUNTER — Encounter: Payer: Self-pay | Admitting: Physician Assistant

## 2018-05-07 ENCOUNTER — Ambulatory Visit: Payer: BC Managed Care – PPO | Admitting: Physician Assistant

## 2018-05-07 VITALS — BP 136/84 | HR 76 | Temp 98.0°F | Resp 18 | Ht 62.0 in | Wt 243.2 lb

## 2018-05-07 DIAGNOSIS — L239 Allergic contact dermatitis, unspecified cause: Secondary | ICD-10-CM | POA: Diagnosis not present

## 2018-05-07 NOTE — Progress Notes (Signed)
Patient ID: Mary Conley MRN: [id number], DOB: 02/06/1971, 47 y.o. Date of Encounter: 05/07/2018, 9:44 AM    Chief Complaint:  Chief Complaint  Patient presents with  . Rash on left arm    symptoms for 1 week      HPI: 47 y.o. year old female presents for above.   She has developed a small area of rash on the volar aspect of her last wrist. Rash consists of small tiny vesicles.  These appear in a circular / round distribution. Diameter of entire rash is approx 1 cm. Each vessicle is approx 88m.   Today is Monday. She reports that last Tuesday, which would have been 6 days ago, she remembers taking her watch off that day because she was feeling some itching on her wrist. States that on Wednesday she saw 2 little blisters. Since then it has developed into current appearance.  States that she has noticed some itchiness around the area. She has tried treating with using Zyrtec in the morning and Benadryl at night and has tried applying calamine lotion and cortisone cream.  Never felt any sting or bite.  Never saw any type of spider bee insect etc.  Unaware of coming in contact with any known allergen.\ She is not reporting any type of burning or pain sensation to suspect herpes zoster /shingles.  Currently there is only one area of rash.  No linear distribution.    She has had no systemic symptoms.  No fevers or chills.  No abdominal pain.  No nausea or vomiting.  Home Meds:   Outpatient Medications Prior to Visit  Medication Sig Dispense Refill  . Vitamin D, Ergocalciferol, (DRISDOL) 50000 UNITS CAPS capsule Take 1 capsule (50,000 Units total) by mouth every 7 (seven) days. 4 capsule 3   No facility-administered medications prior to visit.     Allergies: No Known Allergies    Review of Systems: See HPI for pertinent ROS. All other ROS negative.    Physical Exam: Blood pressure 136/84, pulse 76, temperature 98 F (36.7 C), temperature source Oral, resp. rate 18,  height 5' 2"  (1.575 m), weight 110.3 kg, SpO2 99 %., Body mass index is 44.48 kg/m. General:  WF. Appears in no acute distress. Neck: Supple. No thyromegaly. No lymphadenopathy. Lungs: Clear bilaterally to auscultation without wheezes, rales, or rhonchi. Breathing is unlabored. Heart: Regular rhythm. No murmurs, rubs, or gallops. Msk:  Strength and tone normal for 47. Extremities/Skin:  Area of rash is located on volar aspect of left wrist. Rash consists of small tiny vesicles.  These appear in a circular / round distribution. Diameter of entire rash is approx 1 cm. Each vessicle is approx 244m  There is some mild erythema.  Slightly more erythema towards the center and less erythema around the periphery.  Neuro: Alert and oriented X 3. Moves all extremities spontaneously. Gait is normal. CNII-XII grossly in tact. Psych:  Responds to questions appropriately with a normal affect.     ASSESSMENT AND PLAN:  [age].o. year old female with   1. Allergic dermatitis I am and symptoms are most consistent with some type of allergic dermatitis.  She may have received some type of insect bite.  She is to continue taking Benadryl orally and can apply hydrocortisone cream.  To monitor.  If rash worsens or develops additional signs or symptoms then follow-up.  Other consideration in the differential would be herpes zoster/shingles.  However there is only one cluster of vesicles and she  is not describing any burning or pain.  If she develops additional areas of rash or burning pain then definitely would consider adjusting treatment to cover this.   [address] [place], [place], [facility] 05/07/2018 9:44 AM

## 2018-05-08 ENCOUNTER — Ambulatory Visit: Payer: BC Managed Care – PPO | Admitting: Family Medicine

## 2018-05-08 VITALS — BP 124/74 | HR 84 | Temp 98.3°F | Resp 16 | Wt 243.5 lb

## 2018-05-08 DIAGNOSIS — B354 Tinea corporis: Secondary | ICD-10-CM | POA: Diagnosis not present

## 2018-05-08 MED ORDER — CLOTRIMAZOLE 1 % EX CREA: 1.0000 "application " | TOPICAL_CREAM | Freq: Two times a day (BID) | CUTANEOUS | 0 refills | Status: DC

## 2018-05-08 NOTE — Patient Instructions (Signed)
Apply clotrimazole or terbinafine topically 2x a day for several weeks, after looking improved apply meds for one more week.   Body Ringworm Body ringworm is an infection of the skin that often causes a ring-shaped rash. Body ringworm can affect any part of your skin. It can spread easily to others. Body ringworm is also called tinea corporis. What are the causes? This condition is caused by funguses called dermatophytes. The condition develops when these funguses grow out of control on the skin. You can get this condition if you touch a person or animal that has it. You can also get it if you share clothing, bedding, towels, or any other object with an infected person or pet. What increases the risk? This condition is more likely to develop in:  Athletes who often make skin-to-skin contact with other athletes, such as wrestlers.  People who share equipment and mats.  People with a weakened immune system.  What are the signs or symptoms? Symptoms of this condition include:  Itchy, raised red spots and bumps.  Red scaly patches.  A ring-shaped rash. The rash may have: ? A clear center. ? Scales or red bumps at its center. ? Redness near its borders. ? Dry and scaly skin on or around it.  How is this diagnosed? This condition can usually be diagnosed with a skin exam. A skin scraping may be taken from the affected area and examined under a microscope to see if the fungus is present. How is this treated? This condition may be treated with:  An antifungal cream or ointment.  An antifungal shampoo.  Antifungal medicines. These may be prescribed if your ringworm is severe, keeps coming back, or lasts a long time.  Follow these instructions at home:  Take over-the-counter and prescription medicines only as told by your health care provider.  If you were given an antifungal cream or ointment: ? Use it as told by your health care provider. ? Wash the infected area and dry it  completely before applying the cream or ointment.  If you were given an antifungal shampoo: ? Use it as told by your health care provider. ? Leave the shampoo on your body for 3-5 minutes before rinsing.  While you have a rash: ? Wear loose clothing to stop clothes from rubbing and irritating it. ? Wash or change your bed sheets every night.  If your pet has the same infection, take your pet to see a Animal nutritionist. How is this prevented?  Practice good hygiene.  Wear sandals or shoes in public places and showers.  Do not share personal items with others.  Avoid touching red patches of skin on other people.  Avoid touching pets that have bald spots.  If you touch an animal that has a bald spot, wash your hands. Contact a health care provider if:  Your rash continues to spread after 7 days of treatment.  Your rash is not gone in 4 weeks.  The area around your rash gets red, warm, tender, and swollen. This information is not intended to replace advice given to you by your health care provider. Make sure you discuss any questions you have with your health care provider. Document Released: 09/09/2000 Document Revised: 02/18/2016 Document Reviewed: 07/09/2015 Elsevier Interactive Patient Education  [employer].

## 2018-05-08 NOTE — Progress Notes (Signed)
Patient ID: Mary Conley, female    DOB: 11/10/1970, 47 y.o.   MRN: [id number]  PCP: Susy Frizzle, MD  Chief Complaint  Patient presents with  . Rash    Patient in today with red, round, circular, raised rash    Subjective:   Mary Conley is a 47 y.o. female, presents to clinic with CC of worsening rash to left forearm, seen yesterday for the same.  Started roughly one week ago with only two erythematous bumps one week ago, got gradually larger and increased number of bumps turned into one large raised red area.  At first she thought it was poison ivy or shingles, mild irritation and itching, she applied topical OTC cortisone cream with relief of itching but worsening size of raised red area. Also tried calamine, w/o improvement.  No other spreading of rash.  No associated sx.  Is anxious for a diagnosis, concerned now that it is ring worm.  Back to recheck today due to worsening.   There are no active problems to display for this patient.    Prior to Admission medications   Not on File     Allergies  Allergen Reactions  . Prednisone     Other reaction(s): Flushing     Family History  Problem Relation Age of Onset  . Heart attack Unknown   . Hypertension Unknown   . Diabetes Unknown      Social History   Socioeconomic History  . Marital status: Married    Spouse name: Not on file  . Number of children: 2  . Years of education: Not on file  . Highest education level: Not on file  Occupational History  . Occupation: Pharmacist, hospital / Leisure centre manager  Social Needs  . Financial resource strain: Not on file  . Food insecurity:    Worry: Not on file    Inability: Not on file  . Transportation needs:    Medical: Not on file    Non-medical: Not on file  Tobacco Use  . Smoking status: Never Smoker  . Smokeless tobacco: Never Used  Substance and Sexual Activity  . Alcohol use: No  . Drug use: Not on file  . Sexual activity: Not on file  Lifestyle  . Physical  activity:    Days per week: Not on file    Minutes per session: Not on file  . Stress: Not on file  Relationships  . Social connections:    Talks on phone: Not on file    Gets together: Not on file    Attends religious service: Not on file    Active member of club or organization: Not on file    Attends meetings of clubs or organizations: Not on file    Relationship status: Not on file  . Intimate partner violence:    Fear of current or ex partner: Not on file    Emotionally abused: Not on file    Physically abused: Not on file    Forced sexual activity: Not on file  Other Topics Concern  . Not on file  Social History Narrative  . Not on file     Review of Systems  Constitutional: Negative.   HENT: Negative.   Eyes: Negative.   Respiratory: Negative.   Cardiovascular: Negative.   Gastrointestinal: Negative.   Endocrine: Negative.   Genitourinary: Negative.   Musculoskeletal: Negative.   Skin: Negative.   Allergic/Immunologic: Negative.   Neurological: Negative.   Hematological: Negative.   Psychiatric/Behavioral:  Negative.   All other systems reviewed and are negative.      Objective:    Vitals:   05/08/18 1611  BP: 124/74  Pulse: 84  Resp: 16  Temp: 98.3 F (36.8 C)  TempSrc: Oral  SpO2: 98%  Weight: 243 lb 8 oz (110.5 kg)      Physical Exam  Constitutional: She appears well-developed.  HENT:  Head: Normocephalic and atraumatic.  Nose: Nose normal.  Eyes: Conjunctivae are normal. Right eye exhibits no discharge. Left eye exhibits no discharge.  Neck: No tracheal deviation present.  Cardiovascular: Normal rate and regular rhythm.  Pulmonary/Chest: Effort normal. No stridor. No respiratory distress.  Musculoskeletal: Normal range of motion.  Neurological: She is alert. She exhibits normal muscle tone. Coordination normal.  Skin: Skin is warm and dry. Rash noted. There is erythema.  Raised erythematous annular rash to left forearm in circular shape  roughly 2 cm in diameter with mild central clearing.  No surrounding edema, erythema, induration, tenderness.  No excoriations  Psychiatric: She has a normal mood and affect. Her behavior is normal.  Nursing note and vitals reviewed.         Assessment & Plan:      ICD-10-CM   1. Tinea corporis B35.4 clotrimazole (LOTRIMIN) 1 % cream   Topical antifungal BID x 2-3 weeks, switch to terbinafine if not effective, d/c topical steroids, can continue antihistamine/benadryl as needed for itching.   Delsa Grana, PA-C 05/08/18 4:14 PM

## 2019-05-01 ENCOUNTER — Emergency Department (HOSPITAL_COMMUNITY): Payer: BC Managed Care – PPO

## 2019-05-01 ENCOUNTER — Encounter (HOSPITAL_COMMUNITY): Payer: Self-pay | Admitting: Emergency Medicine

## 2019-05-01 ENCOUNTER — Other Ambulatory Visit: Payer: Self-pay

## 2019-05-01 ENCOUNTER — Emergency Department (HOSPITAL_COMMUNITY)
Admission: EM | Admit: 2019-05-01 | Discharge: 2019-05-01 | Disposition: A | Discharge status: Home / Self Care | Payer: BC Managed Care – PPO | Attending: Emergency Medicine | Admitting: Emergency Medicine

## 2019-05-01 DIAGNOSIS — R52 Pain, unspecified: Secondary | ICD-10-CM

## 2019-05-01 DIAGNOSIS — M545 Low back pain, unspecified: Secondary | ICD-10-CM

## 2019-05-01 DIAGNOSIS — N3000 Acute cystitis without hematuria: Secondary | ICD-10-CM

## 2019-05-01 LAB — CBC WITH DIFFERENTIAL/PLATELET
Abs Immature Granulocytes: 0.03 10*3/uL (ref 0.00–0.07)
Basophils Absolute: 0 10*3/uL (ref 0.0–0.1)
Basophils Relative: 0 %
Eosinophils Absolute: 0.1 10*3/uL (ref 0.0–0.5)
Eosinophils Relative: 2 %
HCT: 37.7 % (ref 36.0–46.0)
Hemoglobin: 11.9 g/dL — ABNORMAL LOW (ref 12.0–15.0)
Immature Granulocytes: 0 %
Lymphocytes Relative: 20 %
Lymphs Abs: 1.5 10*3/uL (ref 0.7–4.0)
MCH: 28.7 pg (ref 26.0–34.0)
MCHC: 31.6 g/dL (ref 30.0–36.0)
MCV: 90.8 fL (ref 80.0–100.0)
Monocytes Absolute: 0.4 10*3/uL (ref 0.1–1.0)
Monocytes Relative: 5 %
Neutro Abs: 5.6 10*3/uL (ref 1.7–7.7)
Neutrophils Relative %: 73 %
Platelets: 269 10*3/uL (ref 150–400)
RBC: 4.15 MIL/uL (ref 3.87–5.11)
RDW: 14.3 % (ref 11.5–15.5)
WBC: 7.6 10*3/uL (ref 4.0–10.5)
nRBC: 0 % (ref 0.0–0.2)

## 2019-05-01 LAB — URINALYSIS, ROUTINE W REFLEX MICROSCOPIC
Bilirubin Urine: NEGATIVE
Glucose, UA: NEGATIVE mg/dL
Hgb urine dipstick: NEGATIVE
Ketones, ur: NEGATIVE mg/dL
Nitrite: POSITIVE — AB
Protein, ur: NEGATIVE mg/dL
Specific Gravity, Urine: 1.027 (ref 1.005–1.030)
pH: 5 (ref 5.0–8.0)

## 2019-05-01 LAB — BASIC METABOLIC PANEL
Anion gap: 8 (ref 5–15)
BUN: 11 mg/dL (ref 6–20)
CO2: 26 mmol/L (ref 22–32)
Calcium: 9.3 mg/dL (ref 8.9–10.3)
Chloride: 104 mmol/L (ref 98–111)
Creatinine, Ser: 0.62 mg/dL (ref 0.44–1.00)
GFR calc Af Amer: >60 mL/min (ref >60)
GFR calc non Af Amer: >60 mL/min (ref >60)
Glucose, Bld: 147 mg/dL — ABNORMAL HIGH (ref 70–99)
Potassium: 4.2 mmol/L (ref 3.5–5.1)
Sodium: 138 mmol/L (ref 135–145)

## 2019-05-01 MED ORDER — METHOCARBAMOL 500 MG PO TABS: 500.0000 mg | ORAL_TABLET | Freq: Two times a day (BID) | ORAL | 0 refills | Status: DC

## 2019-05-01 MED ORDER — CEPHALEXIN 500 MG PO CAPS: 500.0000 mg | ORAL_CAPSULE | Freq: Two times a day (BID) | ORAL | 0 refills | Status: AC

## 2019-05-01 NOTE — ED Notes (Signed)
Pt is alert and oriented x 4 and is verbally responsive. Pt reports that she has been having lower back pain that radiates to sacrum. Pt reports that symptoms started 2 weeks ago. Pt denies any falls or injuries. Pt reports that she took advil prior to coming to ED and has improved her pain 4/10. Pt reports that pain was 10/10.

## 2019-05-01 NOTE — ED Provider Notes (Signed)
[facility] DEPT Provider Note   CSN: [id number] Arrival date & time: 05/01/19  0932    History   Chief Complaint Chief Complaint  Patient presents with   Back Pain    HPI Mary Conley is a 48 y.o. female who presents to the ED today complaining of sudden onset, constant, achy, low back pain radiating into bilateral buttocks x 2 weeks. No recent injury or overuse prior to back pain beginning. Pt reports that the pain eases off throughout the day when she is on her feet but is worse with sitting down. She has been taking Advil with mild relief. Reports that this morning around 3 AM the pain became very severe, prompting patient to come to the ED today. She reports that sometimes the pain comes into her lower abdomen. She has hx of endometrial ablation 4-5 years ago and states that sometime she has abdominal pain that she associates with cramping as if she is still having a period. She assumed this was similar but given it has lasted 2 weeks now she is concerned it could be something else. Denies fever, chills, nausea, vomiting, diarrhea, constipation, urinary symptoms including urinary retention and incontinence, bowel incontinence, saddle anesthesia, or any other associated symptoms. No hx IVDA. No hx chronic steroid use. No recent spinal manipulations. No hx kidney stones.        Past Medical History:  Diagnosis Date   Chest pain    GERD (gastroesophageal reflux disease)    Hypercholesteremia     There are no active problems to display for this patient.   Past Surgical History:  Procedure Laterality Date   CESAREAN SECTION       OB History   No obstetric history on file.      Home Medications    Prior to Admission medications   Medication Sig Start Date End Date Taking? Authorizing Provider  Cholecalciferol 25 MCG (1000 UT) capsule Take 2,000 Units by mouth daily.   Yes [provider]  metFORMIN (GLUCOPHAGE-XR) 500 MG 24  hr tablet Take 1,000 mg by mouth 2 (two) times daily.   Yes [provider]  cephALEXin (KEFLEX) 500 MG capsule Take 1 capsule (500 mg total) by mouth 2 (two) times daily for 7 days. 05/01/19 05/08/19  Eustaquio Maize, PA-C  clotrimazole (LOTRIMIN) 1 % cream Apply 1 application topically 2 (two) times daily. Patient not taking: Reported on 05/01/2019 05/08/18   Delsa Grana, PA-C  methocarbamol (ROBAXIN) 500 MG tablet Take 1 tablet (500 mg total) by mouth 2 (two) times daily. 05/01/19   Eustaquio Maize, PA-C    Family History Family History  Problem Relation Age of Onset   Heart attack Other    Hypertension Other    Diabetes Other     Social History Social History   Tobacco Use   Smoking status: Never Smoker   Smokeless tobacco: Never Used  Substance Use Topics   Alcohol use: No   Drug use: Not on file     Allergies   Prednisone   Review of Systems Review of Systems  Constitutional: Negative for chills and fever.  Gastrointestinal: Positive for abdominal pain. Negative for constipation, diarrhea, nausea and vomiting.  Genitourinary: Negative for difficulty urinating and dysuria.  Musculoskeletal: Positive for back pain. Negative for gait problem.  Skin: Negative for rash.     Physical Exam Updated Vital Signs BP 127/81 (BP Location: Left Arm)    Pulse 82    Temp 98.6 F (37  C) (Oral)    Resp 16    SpO2 98%   Physical Exam Vitals signs and nursing note reviewed.  Constitutional:      Appearance: She is not ill-appearing.  HENT:     Head: Normocephalic and atraumatic.  Eyes:     Conjunctiva/sclera: Conjunctivae normal.  Neck:     Musculoskeletal: Neck supple.  Cardiovascular:     Rate and Rhythm: Normal rate and regular rhythm.     Pulses: Normal pulses.  Pulmonary:     Effort: Pulmonary effort is normal.     Breath sounds: Normal breath sounds. No wheezing, rhonchi or rales.  Abdominal:     Palpations: Abdomen is soft.     Tenderness: There is no  abdominal tenderness. There is no right CVA tenderness, left CVA tenderness, guarding or rebound.  Musculoskeletal:     Comments: Mild T and L midline spinal tenderness with paraspinal tenderness bilaterally. No obvious tenderness into hips. + SLR on left. Strength 5/5 to BLEs. Sensation intact throughout. Good distal pulses.   Skin:    General: Skin is warm and dry.  Neurological:     Mental Status: She is alert.      ED Treatments / Results  Labs (all labs ordered are listed, but only abnormal results are displayed) Labs Reviewed  URINALYSIS, ROUTINE W REFLEX MICROSCOPIC - Abnormal; Notable for the following components:      Result Value   APPearance HAZY (*)    Nitrite POSITIVE (*)    Leukocytes,Ua TRACE (*)    Bacteria, UA RARE (*)    All other components within normal limits  BASIC METABOLIC PANEL - Abnormal; Notable for the following components:   Glucose, Bld 147 (*)    All other components within normal limits  CBC WITH DIFFERENTIAL/PLATELET - Abnormal; Notable for the following components:   Hemoglobin 11.9 (*)    All other components within normal limits  URINE CULTURE    EKG None  Radiology Dg Thoracic Spine 2 View  Result Date: 05/01/2019 CLINICAL DATA:  Back pain. EXAM: THORACIC SPINE 2 VIEWS COMPARISON:  None. FINDINGS: There is no evidence of thoracic spine fracture. Alignment is normal. No other significant bone abnormalities are identified. IMPRESSION: Negative. Electronically Signed   By: Fidela Salisbury M.D.   On: 05/01/2019 09:00   Dg Lumbar Spine Complete  Result Date: 05/01/2019 CLINICAL DATA:  Low back pain. EXAM: LUMBAR SPINE - COMPLETE 4+ VIEW COMPARISON:  None. FINDINGS: There is no evidence of lumbar spine fracture. Alignment is normal. Intervertebral disc spaces are maintained. IMPRESSION: Negative. Electronically Signed   By: Fidela Salisbury M.D.   On: 05/01/2019 08:59   Ct Renal Stone Study  Result Date: 05/01/2019 CLINICAL DATA:  Left  flank pain EXAM: CT ABDOMEN AND PELVIS WITHOUT CONTRAST TECHNIQUE: Multidetector CT imaging of the abdomen and pelvis was performed following the standard protocol without IV contrast. COMPARISON:  None. FINDINGS: Lower chest: No acute abnormality. Hepatobiliary: Diffusely decreased attenuation of the hepatic parenchyma. No focal liver lesion identified. Gallbladder has an unremarkable noncontrast appearance without hyperdense gallstone. Pancreas: Unremarkable. No pancreatic ductal dilatation or surrounding inflammatory changes. Spleen: Normal in size without focal abnormality. Adrenals/Urinary Tract: Adrenal glands are unremarkable. Kidneys are normal, without renal calculi, focal lesion, or hydronephrosis. Ureters nondilated. Bladder is unremarkable. Stomach/Bowel: Stomach is within normal limits. Appendix appears normal. Scattered colonic diverticulosis. No evidence of bowel wall thickening, distention, or inflammatory changes. Vascular/Lymphatic: No significant vascular findings are present. No enlarged abdominal or pelvic  lymph nodes. Reproductive: Uterus and bilateral adnexa are unremarkable. Other: No abdominal wall hernia or abnormality. No abdominopelvic ascites. Musculoskeletal: No acute or significant osseous findings. IMPRESSION: 1. No acute abdominopelvic findings. 2. No evidence of obstructive uropathy. 3. Scattered colonic diverticulosis without evidence of acute diverticulitis. 4. Hepatic steatosis. Electronically Signed   By: Davina Poke M.D.   On: 05/01/2019 10:25    Procedures Procedures (including critical care time)  Medications Ordered in ED Medications - No data to display   Initial Impression / Assessment and Plan / ED Course  I have reviewed the triage vital signs and the nursing notes.  Pertinent labs & imaging results that were available during my care of the patient were reviewed by me and considered in my medical decision making (see chart for details).    48 year  old female presenting to the ED today with complaints of lower back pain radiating into bilateral buttocks area. No paresthesias. No red flag symptoms concerning for cauda equina. No focal neuro deficits. Pt also endorses intermittent radiating to lower abdominal region without urinary complaints. She is completely nontender on exam today to abdomen. No CVA tenderness. Will obtain U/A as well as DG T and L spine to look for joint space narrowing. Do not feel patient needs further imaging at this time including CT or MRI.   Xrays negative at this time. No joint space narrowing appreciated. U/A positive for leuks, nitrites, and 6-10 RBCs per HPF. No blood appreciated on dip stick. Upon further eval patient does have some mild right CVA tenderness. Will obtain CT renal stone study today to rule out infected stone vs pyelo. Baseline bloodwork also obtained. Pt's back pain may be related or may be 2 separate issues.   CT negative for stone or pyelo. Question whether pt's pain is related to the UTI or not. Will send urine for culture and treat regardless given pt has had some abdominal pain as well. Have written for muscle relaxers as well give pts back pain. She reports allergy to prednisone; will hold off at this time as pt does not truly have sciatica symptoms. Will have her follow up with orthopedist if no improvement with muscle relaxers. Strict return precautions discussed. Pt is in agreement with plan at this time and stable for discharge home.          Final Clinical Impressions(s) / ED Diagnoses   Final diagnoses:  Acute bilateral low back pain without sciatica  Acute cystitis without hematuria    ED Discharge Orders         Ordered    cephALEXin (KEFLEX) 500 MG capsule  2 times daily     05/01/19 1113    methocarbamol (ROBAXIN) 500 MG tablet  2 times daily     05/01/19 [address], PA-C 05/01/19 1458    Fredia Sorrow, MD 05/03/19 [phone]

## 2019-05-01 NOTE — ED Triage Notes (Signed)
Patient here from home with complaints of mid/left sided back pain radiating down into left hip and leg. Advil with little relief.

## 2019-05-01 NOTE — Discharge Instructions (Signed)
You were seen in the ED today for lower back pain on both sides  X rays were obtained of your thoracic and lumbar spine without any acute findings  I have written for muscle relaxers to see if this resolves your pain. Please take as prescribed; I recommend using at nighttime or during the day if you aren't expected to drive around/go to work as these can make you drowsy If no improvement in symptoms please follow up with Orthopedist Dr. Percell Miller for further evaluation   While in the ED we obtained a urine sample which appeared positive for a UTI Your CT scan was negative for any kind of infected kidney stone or kidney infection I have written antibiotics for you You will receive a call in 2-3 days if the antibiotics need to be changed per the urine culture  Your back pain may be related to the UTI as well although it is difficult to say at this point in time. If your back pain resolves with the antibiotics then I would suggest following up with your PCP instead of orthopedics.   Return to the ED for any worsening symptoms including fever, chills, urinary retention, if you begin peeing or pooping on yourself, or have numbness in your groin area.

## 2019-05-03 LAB — URINE CULTURE: Culture: >=100000 — AB

## 2019-05-04 ENCOUNTER — Telehealth: Payer: Self-pay | Admitting: Emergency Medicine

## 2019-05-04 NOTE — Telephone Encounter (Signed)
Post ED Visit - Positive Culture Follow-up  Culture report reviewed by antimicrobial stewardship pharmacist: [facility] Team []  Elenor Quinones, Pharm.D. []  Heide Guile, Pharm.D., BCPS AQ-ID []  Parks Neptune, Pharm.D., BCPS []  Alycia Rossetti, Pharm.D., BCPS []  [place], [place].D., BCPS, AAHIVP []  Legrand Como, Pharm.D., BCPS, AAHIVP []  Salome Arnt, PharmD, BCPS []  Johnnette Gourd, PharmD, BCPS []  Hughes Better, PharmD, BCPS []  Leeroy Cha, PharmD []  Laqueta Linden, PharmD, BCPS []  Albertina Parr, PharmD  [facility] Team []  Leodis Sias, PharmD []  Lindell Spar, PharmD []  Royetta Asal, PharmD []  Graylin Shiver, Rph []  Rema Fendt) Glennon Mac, PharmD []  Arlyn Dunning, PharmD []  Netta Cedars, PharmD [x]  Dia Sitter, PharmD []  Leone Haven, PharmD []  Gretta Arab, PharmD []  Theodis Shove, PharmD []  Peggyann Juba, PharmD []  Reuel Boom, PharmD   Positive urine culture Treated with Cephalexin, organism sensitive to the same and no further patient follow-up is required at this time.  Sandi Raveling Maury Bamba 05/04/2019, 3:09 PM

## 2019-08-15 ENCOUNTER — Other Ambulatory Visit: Payer: Self-pay

## 2019-08-15 DIAGNOSIS — Z20822 Contact with and (suspected) exposure to covid-19: Secondary | ICD-10-CM

## 2019-08-18 LAB — NOVEL CORONAVIRUS, NAA: SARS-CoV-2, NAA: NOT DETECTED

## 2019-09-04 ENCOUNTER — Other Ambulatory Visit: Payer: Self-pay

## 2019-09-04 ENCOUNTER — Encounter: Payer: Self-pay | Admitting: Family Medicine

## 2019-09-04 ENCOUNTER — Ambulatory Visit (INDEPENDENT_AMBULATORY_CARE_PROVIDER_SITE_OTHER): Payer: BC Managed Care – PPO | Admitting: Family Medicine

## 2019-09-04 VITALS — BP 130/78 | HR 102 | Temp 97.3°F | Resp 16 | Ht 62.5 in | Wt 236.0 lb

## 2019-09-04 DIAGNOSIS — E119 Type 2 diabetes mellitus without complications: Secondary | ICD-10-CM | POA: Diagnosis not present

## 2019-09-04 DIAGNOSIS — M545 Low back pain, unspecified: Secondary | ICD-10-CM

## 2019-09-04 DIAGNOSIS — R81 Glycosuria: Secondary | ICD-10-CM | POA: Diagnosis not present

## 2019-09-04 LAB — URINALYSIS, ROUTINE W REFLEX MICROSCOPIC
Bilirubin Urine: NEGATIVE
Hgb urine dipstick: NEGATIVE
Ketones, ur: NEGATIVE
Leukocytes,Ua: NEGATIVE
Nitrite: NEGATIVE
Protein, ur: NEGATIVE
Specific Gravity, Urine: 1.025 (ref 1.001–1.03)
pH: 7 (ref 5.0–8.0)

## 2019-09-04 MED ORDER — METHOCARBAMOL 500 MG PO TABS: 500.0000 mg | ORAL_TABLET | Freq: Two times a day (BID) | ORAL | 0 refills | Status: DC

## 2019-09-04 MED ORDER — MELOXICAM 15 MG PO TABS: 15.0000 mg | ORAL_TABLET | Freq: Every day | ORAL | 1 refills | Status: DC

## 2019-09-04 NOTE — Patient Instructions (Signed)
F/U as needed

## 2019-09-04 NOTE — Progress Notes (Signed)
Subjective:    Patient ID: Mary Conley, female    DOB: [birth year], 48 y.o.   MRN: [id number]  Patient presents for Back pain  Patient here with concern for urinary tract infection.  She was seen in the emergency room back in August ( reviewed note and imaging)  she presented with back pain and discomfort.  Urine culture did come back with E. coli she was treated with Keflex at that time.  She was also given muscle relaxer for her back discomfort.  She ended up seeing Raliegh Ip she had an MRI has mild bulging disc at L4-L5 they advised physical therapy staying active but no other medical treatment was needed.  She is a Pharmacist, hospital.  She has been sitting for multiple hours a day doing [employer].  She has had increased back discomfort the past 2 weeks.  No urinary symptoms of pressure burning hematuria.  No change in her bowels.  She has had some pain that goes from her lower back into her right groin and sometimes into the thigh.  No incontinence symptoms.  No tingling or numbness in the feet.  She did take a couple dose of ibuprofen which helps some.  She denies any significant back spasm.   He is followed by Dr. Chalmers Cater endocrinology for her diabetes.  She had her Metformin reduced a month or so ago and admits that she has not been checking her blood sugars regularly and has been more liberal with her diet she had 1+ glucose in her urine sample today states that she had [facility] earlier  Also asked for me to check her right ear she gets some discomfort on the tragus Review Of Systems:  GEN- denies fatigue, fever, weight loss,weakness, recent illness HEENT- denies eye drainage, change in vision, nasal discharge, CVS- denies chest pain, palpitations RESP- denies SOB, cough, wheeze ABD- denies N/V, change in stools, abd pain GU- denies dysuria, hematuria, dribbling, incontinence MSK-+ joint pain, +muscle aches, injury Neuro- denies headache, dizziness, syncope, seizure activity        Objective:    BP 130/78   Pulse (!) 102   Temp (!) 97.3 F (36.3 C) (Other (Comment))   Resp 16   Ht 5' 2.5" (1.588 m)   Wt 236 lb (107 kg)   SpO2 98%   BMI 42.48 kg/m  GEN- NAD, alert and oriented x3 HEENT- PERRL, EOMI, non injected sclera, pink conjunctiva, MMM, oropharynx clear, TM clear bilaterally canals clear, tragus NT, CVS- RRR, no murmur RESP-CTAB ABD-NABS,soft,NT,ND, no CVA tenderness MSK- mild TTP Mid lower lumbar spine, no spasm, good ROM, neg SLR, mild discomfort IR right hip, normal ER bilat, fair ROM knees, no effusion Neuro- normal strength Bilat LE, sensation in tact LE  Non antalgic gait  EXT- No edema Pulses- Radial, DP- 2+        Assessment & Plan:      Problem List Items Addressed This Visit    None    Visit Diagnoses    Low back pain without sciatica, unspecified back pain laterality, unspecified chronicity    -  Primary   I think this is due to MSK pain, sitting long hours, no red flags, has some mild back pathology, recommend MOBIC for inflammation robaxin prn, with UTI history of send urine for culture I will not start any antibiotics today she is not given any classic symptoms.     Glucosuria- pt is diabetic type 2   With regards to the glucose  in her urine recommend that she check her blood sugars and follow-up with her endocrinologist.  Also cut back on the soda as well as the baked goods and sweets which she has been over indulging in recently.  If her blood sugar is elevated her Metformin may need to be adjusted back up.   Relevant Medications   meloxicam (MOBIC) 15 MG tablet   methocarbamol (ROBAXIN) 500 MG tablet   Other Relevant Orders   Urinalysis, Routine w reflex microscopic (Completed)   Urine Culture      Note: This dictation was prepared with Dragon dictation along with smaller phrase technology. Any transcriptional errors that result from this process are unintentional.

## 2019-09-06 LAB — URINE CULTURE
MICRO NUMBER:: 1180665
SPECIMEN QUALITY:: ADEQUATE

## 2019-12-01 ENCOUNTER — Ambulatory Visit: Payer: BC Managed Care – PPO | Attending: Internal Medicine

## 2019-12-01 DIAGNOSIS — Z23 Encounter for immunization: Secondary | ICD-10-CM | POA: Insufficient documentation

## 2019-12-01 NOTE — Progress Notes (Signed)
   Covid-19 Vaccination Clinic  Name:  Mary Conley    MRN: [id number] DOB: [birth year]  12/01/2019  [name] was observed post Covid-19 immunization for 15 minutes without incident. She was provided with Vaccine Information Sheet and instruction to access the V-Safe system.   [name] was instructed to call 911 with any severe reactions post vaccine: Marland Kitchen Difficulty breathing  . Swelling of face and throat  . A fast heartbeat  . A bad rash all over body  . Dizziness and weakness   Immunizations Administered    Name Date Dose VIS Date Route   Pfizer COVID-19 Vaccine 12/01/2019  6:46 PM 0.3 mL 09/06/2019 Intramuscular   Manufacturer: [place]   Lot: [id number]   [place]: [id number]

## 2019-12-12 ENCOUNTER — Ambulatory Visit: Payer: BC Managed Care – PPO

## 2019-12-22 ENCOUNTER — Ambulatory Visit: Payer: BC Managed Care – PPO | Attending: Internal Medicine

## 2019-12-22 DIAGNOSIS — Z23 Encounter for immunization: Secondary | ICD-10-CM

## 2019-12-22 NOTE — Progress Notes (Signed)
   Covid-19 Vaccination Clinic  Name:  CLOE SOCKWELL    MRN: [id number] DOB: 1970-10-10  12/22/2019  Ms. Denunzio was observed post Covid-19 immunization for 15 minutes without incident. She was provided with Vaccine Information Sheet and instruction to access the V-Safe system.   Ms. Delio was instructed to call 911 with any severe reactions post vaccine: Marland Kitchen Difficulty breathing  . Swelling of face and throat  . A fast heartbeat  . A bad rash all over body  . Dizziness and weakness   Immunizations Administered    Name Date Dose VIS Date Route   Pfizer COVID-19 Vaccine 12/22/2019 12:58 PM 0.3 mL 09/06/2019 Intramuscular   Manufacturer: [place]   Lot: [id number]   [place]: [id number]

## 2020-01-14 ENCOUNTER — Encounter: Payer: Self-pay | Admitting: Emergency Medicine

## 2020-01-14 ENCOUNTER — Other Ambulatory Visit: Payer: Self-pay

## 2020-01-14 ENCOUNTER — Ambulatory Visit
Admission: EM | Admit: 2020-01-14 | Discharge: 2020-01-14 | Disposition: A | Discharge status: Home / Self Care | Payer: BC Managed Care – PPO | Attending: Emergency Medicine | Admitting: Emergency Medicine

## 2020-01-14 DIAGNOSIS — J069 Acute upper respiratory infection, unspecified: Secondary | ICD-10-CM | POA: Diagnosis not present

## 2020-01-14 DIAGNOSIS — J0100 Acute maxillary sinusitis, unspecified: Secondary | ICD-10-CM

## 2020-01-14 MED ORDER — BENZONATATE 100 MG PO CAPS: 100.0000 mg | ORAL_CAPSULE | Freq: Three times a day (TID) | ORAL | 0 refills | Status: DC

## 2020-01-14 MED ORDER — AZITHROMYCIN 250 MG PO TABS: 250.0000 mg | ORAL_TABLET | Freq: Every day | ORAL | 0 refills | Status: DC

## 2020-01-14 MED ORDER — PREDNISONE 10 MG (21) PO TBPK: ORAL_TABLET | ORAL | 0 refills | Status: DC

## 2020-01-14 MED ORDER — CETIRIZINE HCL 10 MG PO TABS: 10.0000 mg | ORAL_TABLET | Freq: Every day | ORAL | 0 refills | Status: DC

## 2020-01-14 NOTE — Discharge Instructions (Addendum)
Zyrtec, Tessalon, prednisone and azithromycin were prescribed Take medication as directed Follow-up with PCP Return or go to ED for worsening symptoms

## 2020-01-14 NOTE — ED Provider Notes (Addendum)
[facility]    CSN: [id number] Arrival date & time: 01/14/20  1114      History   Chief Complaint Chief Complaint  Patient presents with  . URI    HPI Mary Conley is a 49 y.o. female.   Who presented to the urgent care with a complaint of cough, nasal congestion, sinus pressure, sinus pain with green mucus discharge, and body aches for the past 2 to 3 days.  Denies sick exposure to COVID, flu or strep.  Denies recent travel.  Denies aggravating or alleviating symptoms.  Denies previous COVID infection.   Denies fever, chills, fatigue,  rhinorrhea, sore throat,, SOB, wheezing, chest pain, nausea, vomiting, changes in bowel or bladder habits.    The history is provided by the patient. No language interpreter was used.  URI Presenting symptoms: congestion and cough   Associated symptoms: myalgias and sinus pain     Past Medical History:  Diagnosis Date  . Chest pain   . GERD (gastroesophageal reflux disease)   . Hypercholesteremia     Patient Active Problem List   Diagnosis Date Noted  . Type 2 diabetes mellitus without complications ([place]) 23/55/7322    Past Surgical History:  Procedure Laterality Date  . CESAREAN SECTION      OB History   No obstetric history on file.      Home Medications    Prior to Admission medications   Medication Sig Start Date End Date Taking? Authorizing Provider  azithromycin (ZITHROMAX) 250 MG tablet Take 1 tablet (250 mg total) by mouth daily. Take first 2 tablets together, then 1 every day until finished. 01/14/20   Kendyn Zaman, Darrelyn Hillock, FNP  benzonatate (TESSALON) 100 MG capsule Take 1 capsule (100 mg total) by mouth every 8 (eight) hours. 01/14/20   Shatonya Passon, Darrelyn Hillock, FNP  cetirizine (ZYRTEC ALLERGY) 10 MG tablet Take 1 tablet (10 mg total) by mouth daily. 01/14/20   Jacqualin Shirkey, Darrelyn Hillock, FNP  meloxicam (MOBIC) 15 MG tablet Take 1 tablet (15 mg total) by mouth daily. 09/04/19   Alycia Rossetti, MD  metFORMIN  (GLUCOPHAGE-XR) 500 MG 24 hr tablet Take 1,000 mg by mouth 2 (two) times daily.    [provider]  methocarbamol (ROBAXIN) 500 MG tablet Take 1 tablet (500 mg total) by mouth 2 (two) times daily. 09/04/19   [place], Modena Nunnery, MD  predniSONE (STERAPRED UNI-PAK 21 TAB) 10 MG (21) TBPK tablet Take 6 tabs by mouth daily  for 2 days, then 5 tabs for 2 days, then 4 tabs for 2 days, then 3 tabs for 2 days, 2 tabs for 2 days, then 1 tab by mouth daily for 2 days 01/14/20   Emerson Monte, FNP    Family History Family History  Problem Relation Age of Onset  . Heart attack Other   . Hypertension Other   . Diabetes Other     Social History Social History   Tobacco Use  . Smoking status: Never Smoker  . Smokeless tobacco: Never Used  Substance Use Topics  . Alcohol use: No  . Drug use: Not on file     Allergies   Prednisone   Review of Systems Review of Systems  Constitutional: Negative.   HENT: Positive for congestion, sinus pressure and sinus pain.   Respiratory: Positive for cough.   Cardiovascular: Negative.   Gastrointestinal: Negative.   Musculoskeletal: Positive for myalgias.  Neurological: Negative.   All other systems reviewed and are negative.  Physical Exam Triage Vital Signs ED Triage Vitals  Enc Vitals Group     BP 01/14/20 1137 136/86     Pulse Rate 01/14/20 1137 (!) 107     Resp 01/14/20 1137 18     Temp 01/14/20 1137 98.3 F (36.8 C)     Temp Source 01/14/20 1137 Oral     SpO2 01/14/20 1137 96 %     Weight --      Height --      Head Circumference --      Peak Flow --      Pain Score 01/14/20 1138 5     Pain Loc --      Pain Edu? --      Excl. in [place]? --    No data found.  Updated Vital Signs BP 136/86 (BP Location: Right Arm)   Pulse (!) 107   Temp 98.3 F (36.8 C) (Oral)   Resp 18   SpO2 96%   Visual Acuity Right Eye Distance:   Left Eye Distance:   Bilateral Distance:    Right Eye Near:   Left Eye Near:    Bilateral  Near:     Physical Exam Vitals and nursing note reviewed.  Constitutional:      General: She is not in acute distress.    Appearance: Normal appearance. She is normal weight. She is not ill-appearing or toxic-appearing.  HENT:     Head: Normocephalic.     Right Ear: Tympanic membrane, ear canal and external ear normal. There is no impacted cerumen.     Left Ear: Tympanic membrane, ear canal and external ear normal. There is no impacted cerumen.     Nose: No congestion.     Right Sinus: Maxillary sinus tenderness present.     Left Sinus: Maxillary sinus tenderness present.     Mouth/Throat:     Mouth: Mucous membranes are moist.     Pharynx: Oropharynx is clear. No oropharyngeal exudate or posterior oropharyngeal erythema.  Cardiovascular:     Rate and Rhythm: Regular rhythm. Tachycardia present.     Pulses: Normal pulses.     Heart sounds: Normal heart sounds. No murmur.  Pulmonary:     Effort: Pulmonary effort is normal. No respiratory distress.     Breath sounds: Normal breath sounds. No wheezing or rhonchi.  Chest:     Chest wall: No tenderness.  Neurological:     Mental Status: She is alert and oriented to person, place, and time.      UC Treatments / Results  Labs (all labs ordered are listed, but only abnormal results are displayed) Labs Reviewed - No data to display  EKG   Radiology No results found.  Procedures Procedures (including critical care time)  Medications Ordered in UC Medications - No data to display  Initial Impression / Assessment and Plan / UC Course  I have reviewed the triage vital signs and the nursing notes.  Pertinent labs & imaging results that were available during my care of the patient were reviewed by me and considered in my medical decision making (see chart for details).    Patient is stable for discharge.  Flonase, prednisone azithromycin and Zyrtec were prescribed.  Was advised to follow-up with PCP.  Return or go to ED for  worsening symptoms.  Final Clinical Impressions(s) / UC Diagnoses   Final diagnoses:  Acute non-recurrent maxillary sinusitis  Upper respiratory tract infection, unspecified type     Discharge Instructions  Zyrtec, Tessalon, prednisone and azithromycin were prescribed Take medication as directed Follow-up with PCP Return or go to ED for worsening symptoms    ED Prescriptions    Medication Sig Dispense Auth. Provider   cetirizine (ZYRTEC ALLERGY) 10 MG tablet Take 1 tablet (10 mg total) by mouth daily. 30 tablet Doni Bacha, Darrelyn Hillock, FNP   azithromycin (ZITHROMAX) 250 MG tablet Take 1 tablet (250 mg total) by mouth daily. Take first 2 tablets together, then 1 every day until finished. 6 tablet Rena Sweeden, Darrelyn Hillock, FNP   predniSONE (STERAPRED UNI-PAK 21 TAB) 10 MG (21) TBPK tablet Take 6 tabs by mouth daily  for 2 days, then 5 tabs for 2 days, then 4 tabs for 2 days, then 3 tabs for 2 days, 2 tabs for 2 days, then 1 tab by mouth daily for 2 days 42 tablet Ravon Mortellaro S, FNP   benzonatate (TESSALON) 100 MG capsule Take 1 capsule (100 mg total) by mouth every 8 (eight) hours. 21 capsule Shreshta Medley, Darrelyn Hillock, FNP     PDMP not reviewed this encounter.   Emerson Monte, FNP 01/14/20 1157    Emerson Monte, FNP 01/14/20 1158

## 2020-01-14 NOTE — ED Triage Notes (Addendum)
Pt here for URI sx and congestion with body aches and fever last night; pt had negative covid test this morning

## 2020-01-17 ENCOUNTER — Ambulatory Visit: Payer: BC Managed Care – PPO | Attending: Internal Medicine

## 2020-01-17 ENCOUNTER — Other Ambulatory Visit: Payer: Self-pay

## 2020-01-17 DIAGNOSIS — Z20822 Contact with and (suspected) exposure to covid-19: Secondary | ICD-10-CM

## 2020-01-18 LAB — NOVEL CORONAVIRUS, NAA: SARS-CoV-2, NAA: NOT DETECTED

## 2020-01-18 LAB — SARS-COV-2, NAA 2 DAY TAT

## 2022-03-18 ENCOUNTER — Other Ambulatory Visit: Payer: Self-pay

## 2022-03-18 ENCOUNTER — Other Ambulatory Visit: Payer: BC Managed Care – PPO

## 2022-03-18 DIAGNOSIS — E119 Type 2 diabetes mellitus without complications: Secondary | ICD-10-CM

## 2022-03-19 LAB — COMPREHENSIVE METABOLIC PANEL
AG Ratio: 1.5 (calc) (ref 1.0–2.5)
ALT: 21 U/L (ref 6–29)
AST: 13 U/L (ref 10–35)
Albumin: 4.3 g/dL (ref 3.6–5.1)
Alkaline phosphatase (APISO): 54 U/L (ref 31–125)
BUN: 13 mg/dL (ref 7–25)
CO2: 27 mmol/L (ref 20–32)
Calcium: 9.3 mg/dL (ref 8.6–10.2)
Chloride: 105 mmol/L (ref 98–110)
Creat: 0.67 mg/dL (ref 0.50–0.99)
Globulin: 2.8 g/dL (calc) (ref 1.9–3.7)
Glucose, Bld: 138 mg/dL — ABNORMAL HIGH (ref 65–99)
Potassium: 4.5 mmol/L (ref 3.5–5.3)
Sodium: 140 mmol/L (ref 135–146)
Total Bilirubin: 0.4 mg/dL (ref 0.2–1.2)
Total Protein: 7.1 g/dL (ref 6.1–8.1)

## 2022-03-19 LAB — CBC WITH DIFFERENTIAL/PLATELET
Absolute Monocytes: 435 cells/uL (ref 200–950)
Basophils Absolute: 30 cells/uL (ref 0–200)
Basophils Relative: 0.4 %
Eosinophils Absolute: 98 cells/uL (ref 15–500)
Eosinophils Relative: 1.3 %
HCT: 41.4 % (ref 35.0–45.0)
Hemoglobin: 13.3 g/dL (ref 11.7–15.5)
Lymphs Abs: 1650 cells/uL (ref 850–3900)
MCH: 28.1 pg (ref 27.0–33.0)
MCHC: 32.1 g/dL (ref 32.0–36.0)
MCV: 87.5 fL (ref 80.0–100.0)
MPV: 9.7 fL (ref 7.5–12.5)
Monocytes Relative: 5.8 %
Neutro Abs: 5288 cells/uL (ref 1500–7800)
Neutrophils Relative %: 70.5 %
Platelets: 293 10*3/uL (ref 140–400)
RBC: 4.73 10*6/uL (ref 3.80–5.10)
RDW: 13.7 % (ref 11.0–15.0)
Total Lymphocyte: 22 %
WBC: 7.5 10*3/uL (ref 3.8–10.8)

## 2022-03-19 LAB — LIPID PANEL
Cholesterol: 166 mg/dL (ref <200)
HDL: 45 mg/dL — ABNORMAL LOW (ref >=50)
LDL Cholesterol (Calc): 101 mg/dL (calc) — ABNORMAL HIGH
Non-HDL Cholesterol (Calc): 121 mg/dL (calc) (ref <130)
Total CHOL/HDL Ratio: 3.7 (calc) (ref <5.0)
Triglycerides: 104 mg/dL (ref <150)

## 2022-03-19 LAB — HEMOGLOBIN A1C
Hgb A1c MFr Bld: 7.2 % of total Hgb — ABNORMAL HIGH (ref <5.7)
Mean Plasma Glucose: 160 mg/dL
eAG (mmol/L): 8.9 mmol/L

## 2022-03-25 ENCOUNTER — Ambulatory Visit (INDEPENDENT_AMBULATORY_CARE_PROVIDER_SITE_OTHER): Payer: BC Managed Care – PPO | Admitting: Family Medicine

## 2022-03-25 ENCOUNTER — Encounter: Payer: Self-pay | Admitting: Family Medicine

## 2022-03-25 VITALS — BP 120/80 | HR 76 | Ht 63.0 in | Wt 225.0 lb

## 2022-03-25 DIAGNOSIS — Z0000 Encounter for general adult medical examination without abnormal findings: Secondary | ICD-10-CM

## 2022-03-25 DIAGNOSIS — E119 Type 2 diabetes mellitus without complications: Secondary | ICD-10-CM | POA: Diagnosis not present

## 2022-03-25 DIAGNOSIS — Z1211 Encounter for screening for malignant neoplasm of colon: Secondary | ICD-10-CM

## 2022-03-25 MED ORDER — ROSUVASTATIN CALCIUM 10 MG PO TABS: 10.0000 mg | ORAL_TABLET | Freq: Every day | ORAL | 3 refills | Status: DC

## 2022-03-25 NOTE — Progress Notes (Signed)
Subjective:    Patient ID: Mary Conley, female    DOB: 1971/06/20, 51 y.o.   MRN: [id number]  HPI Patient is here today for complete physical exam.  Her BMI is elevated at 39.  She has a history of type 2 diabetes.  She is currently seeing an endocrinologist.  They recently started Trulicity in addition to her Farxiga/metformin.  This is been less than a month ago.  Her A1c is slightly elevated at 7.2.  She is not on statin.  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  Diabetic foot exam was performed today and was normal.  She is due for colon cancer screening.  Her mammogram and Pap smear are up-to-date and performed at her gynecologist. Past Medical History:  Diagnosis Date   Chest pain    Diabetes mellitus without complication ([place])    GERD (gastroesophageal reflux disease)    Hypercholesteremia    Past Surgical History:  Procedure Laterality Date   CESAREAN SECTION     Current Outpatient Medications on File Prior to Visit  Medication Sig Dispense Refill   Dapagliflozin-metFORMIN HCl ER 10-500 MG TB24 Take by mouth. Dr. Ella Jubilee Dr)     Dulaglutide (TRULICITY) 1.5 EY/8.1KG SOPN Inject into the skin. Dr. Chalmers Cater     azithromycin (ZITHROMAX) 250 MG tablet Take 1 tablet (250 mg total) by mouth daily. Take first 2 tablets together, then 1 every day until finished. (Patient not taking: Reported on 03/25/2022) 6 tablet 0   benzonatate (TESSALON) 100 MG capsule Take 1 capsule (100 mg total) by mouth every 8 (eight) hours. (Patient not taking: Reported on 03/25/2022) 21 capsule 0   cetirizine (ZYRTEC ALLERGY) 10 MG tablet Take 1 tablet (10 mg total) by mouth daily. (Patient not taking: Reported on 03/25/2022) 30 tablet 0   meloxicam (MOBIC) 15 MG tablet Take 1 tablet (15 mg total) by mouth daily. (Patient not taking: Reported on 03/25/2022) 30 tablet 1   metFORMIN (GLUCOPHAGE-XR) 500 MG 24 hr tablet Take 1,000 mg by mouth 2 (two) times daily. (Patient not taking:  Reported on 03/25/2022)     methocarbamol (ROBAXIN) 500 MG tablet Take 1 tablet (500 mg total) by mouth 2 (two) times daily. (Patient not taking: Reported on 03/25/2022) 20 tablet 0   predniSONE (STERAPRED UNI-PAK 21 TAB) 10 MG (21) TBPK tablet Take 6 tabs by mouth daily  for 2 days, then 5 tabs for 2 days, then 4 tabs for 2 days, then 3 tabs for 2 days, 2 tabs for 2 days, then 1 tab by mouth daily for 2 days (Patient not taking: Reported on 03/25/2022) 42 tablet 0   No current facility-administered medications on file prior to visit.   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  Tobacco Use   Smoking status: Never   Smokeless tobacco: Never  Substance and Sexual Activity   Alcohol use: No   Drug use: Not on file   Sexual activity: Not on file  Other Topics Concern   Not on file  Social History Narrative   Not on file   Social Determinants of Health   Financial Resource  Strain: Not on file  Food Insecurity: Not on file  Transportation Needs: Not on file  Physical Activity: Not on file  Stress: Not on file  Social Connections: Not on file  Intimate Partner Violence: Not on file   Family History  Problem Relation Age of Onset   Heart attack Other    Hypertension Other    Diabetes Other       Review of Systems  All other systems reviewed and are negative.      Objective:   Physical Exam Vitals reviewed.  Constitutional:      General: She is not in acute distress.    Appearance: Normal appearance. She is obese. She is not ill-appearing, toxic-appearing or diaphoretic.  HENT:     Head: Normocephalic and atraumatic.     Right Ear: Tympanic membrane and ear canal normal.     Left Ear:  Tympanic membrane and ear canal normal.     Nose: Nose normal. No congestion or rhinorrhea.     Mouth/Throat:     Mouth: Mucous membranes are moist.     Pharynx: Oropharynx is clear. No oropharyngeal exudate or posterior oropharyngeal erythema.  Eyes:     Extraocular Movements: Extraocular movements intact.     Conjunctiva/sclera: Conjunctivae normal.     Pupils: Pupils are equal, round, and reactive to light.  Neck:     Vascular: No carotid bruit.  Cardiovascular:     Rate and Rhythm: Normal rate and regular rhythm.     Pulses: Normal pulses.     Heart sounds: Normal heart sounds. No murmur heard.    No friction rub. No gallop.  Pulmonary:     Effort: Pulmonary effort is normal. No respiratory distress.     Breath sounds: Normal breath sounds. No stridor. No wheezing, rhonchi or rales.  Abdominal:     General: Abdomen is flat. Bowel sounds are normal. There is no distension.     Palpations: Abdomen is soft.     Tenderness: There is no abdominal tenderness. There is no guarding or rebound.  Musculoskeletal:     Cervical back: Normal range of motion. No rigidity.     Right lower leg: No edema.     Left lower leg: No edema.  Lymphadenopathy:     Cervical: No cervical adenopathy.  Skin:    General: Skin is warm.     Coloration: Skin is not jaundiced or pale.     Findings: No bruising, erythema, lesion or rash.  Neurological:     General: No focal deficit present.     Mental Status: She is alert and oriented to person, place, and time.     Cranial Nerves: No cranial nerve deficit.     Motor: No weakness.     Coordination: Coordination normal.     Gait: Gait normal.  Psychiatric:        Mood and Affect: Mood normal.        Behavior: Behavior normal.        Thought Content: Thought content normal.        Judgment: Judgment normal.           Assessment & Plan:   Colon cancer screening - Plan: Cologuard  Type 2 diabetes mellitus without complication, without long-term  current use of insulin (HCC)  General medical exam Recommended diet exercise and weight loss.  Blood pressure is acceptable.  A1c is slightly elevated however I suspect that this will come down with the addition of Trulicity recently.  Diabetic foot exam was performed today and is normal.  Recommended starting Crestor for diabetes for cardiovascular prevention.  Start 10 mg a day.  Recheck annually.  Mammogram and Pap smear performed by gynecology.  Recommended Cologuard for colon cancer screening.  Recommended tetanus shot and a flu shot.  Also recommended Prevnar 20.  Patient defers shots at the present time

## 2022-04-22 LAB — COLOGUARD: COLOGUARD: NEGATIVE

## 2022-10-10 ENCOUNTER — Other Ambulatory Visit (HOSPITAL_COMMUNITY): Payer: Self-pay

## 2022-10-10 MED ORDER — TRULICITY 1.5 MG/0.5ML ~~LOC~~ SOAJ
SUBCUTANEOUS | 6 refills | Status: DC
  Filled 2022-10-10: qty 2, 28d supply, fill #0
  Filled 2022-11-15: qty 2, 28d supply, fill #1
  Filled 2023-01-10: qty 2, 28d supply, fill #2
  Filled 2023-02-13: qty 2, 28d supply, fill #3

## 2022-11-15 ENCOUNTER — Other Ambulatory Visit (HOSPITAL_COMMUNITY): Payer: Self-pay

## 2022-11-15 MED ORDER — TRULICITY 0.75 MG/0.5ML ~~LOC~~ SOAJ
0.7500 mg | SUBCUTANEOUS | 5 refills | Status: DC
  Filled 2022-11-15: qty 2, 28d supply, fill #0

## 2022-11-28 ENCOUNTER — Other Ambulatory Visit (HOSPITAL_COMMUNITY): Payer: Self-pay

## 2023-01-10 ENCOUNTER — Other Ambulatory Visit: Payer: Self-pay

## 2023-01-12 ENCOUNTER — Other Ambulatory Visit (HOSPITAL_COMMUNITY): Payer: Self-pay

## 2023-02-13 ENCOUNTER — Other Ambulatory Visit (HOSPITAL_COMMUNITY): Payer: Self-pay

## 2023-03-29 ENCOUNTER — Other Ambulatory Visit: Payer: Self-pay | Admitting: Family Medicine

## 2023-03-29 NOTE — Telephone Encounter (Signed)
OV needed for additional refills.  Requested Prescriptions  Pending Prescriptions Disp Refills   rosuvastatin (CRESTOR) 10 MG tablet [Pharmacy Med Name: ROSUVASTATIN CALCIUM 10 MG TAB] 90 tablet 0    Sig: TAKE 1 TABLET BY MOUTH EVERY DAY     Cardiovascular:  Antilipid - Statins 2 Failed - 03/29/2023  2:18 AM      Failed - Cr in normal range and within 360 days    Creat  Date Value Ref Range Status  03/18/2022 0.67 0.50 - 0.99 mg/dL Final         Failed - Valid encounter within last 12 months    Recent Outpatient Visits           3 years ago Low back pain without sciatica, unspecified back pain laterality, unspecified chronicity   [facility] [place], Velna Hatchet, MD   4 years ago Tinea corporis   [facility] Danelle Berry, PA-C   4 years ago Allergic dermatitis   [facility] Dorena Bodo, PA-C   7 years ago Dysuria   [facility] Allayne Butcher B, PA-C   8 years ago Chronic neck pain   [facility] [place], Priscille Heidelberg, MD              Failed - Lipid Panel in normal range within the last 12 months    Cholesterol  Date Value Ref Range Status  03/18/2022 166 <200 mg/dL Final   LDL Cholesterol (Calc)  Date Value Ref Range Status  03/18/2022 101 (H) mg/dL (calc) Final    Comment:    Reference range: <100 . Desirable range <100 mg/dL for primary prevention;   <70 mg/dL for patients with CHD or diabetic patients  with > or = 2 CHD risk factors. Marland Kitchen LDL-C is now calculated using the Martin-Hopkins  calculation, which is a validated novel method providing  better accuracy than the Friedewald equation in the  estimation of LDL-C.  Horald Pollen et al. Lenox Ahr. 1610;960(45): 2061-2068  (http://education.[facility].com/faq/FAQ164)    HDL  Date Value Ref Range Status  03/18/2022 45 (L) > OR = 50 mg/dL Final   Triglycerides  Date Value Ref Range Status  03/18/2022 104 <150 mg/dL Final          Passed - Patient is not pregnant

## 2023-05-08 ENCOUNTER — Encounter: Payer: Self-pay | Admitting: Family Medicine

## 2023-07-02 ENCOUNTER — Other Ambulatory Visit: Payer: Self-pay | Admitting: Family Medicine

## 2023-07-03 NOTE — Telephone Encounter (Signed)
Requested medication (s) are due for refill today: yes  Requested medication (s) are on the active medication list: yes  Last refill:  03/29/23  Future visit scheduled: no  Notes to clinic:  Unable to refill per protocol, courtesy refill already given, routing for provider approval.      Requested Prescriptions  Pending Prescriptions Disp Refills   rosuvastatin (CRESTOR) 10 MG tablet [Pharmacy Med Name: ROSUVASTATIN CALCIUM 10 MG TAB] 90 tablet 0    Sig: TAKE 1 TABLET BY MOUTH EVERY DAY     Cardiovascular:  Antilipid - Statins 2 Failed - 07/02/2023  9:48 AM      Failed - Cr in normal range and within 360 days    Creat  Date Value Ref Range Status  03/18/2022 0.67 0.50 - 0.99 mg/dL Final         Failed - Valid encounter within last 12 months    Recent Outpatient Visits           3 years ago Low back pain without sciatica, unspecified back pain laterality, unspecified chronicity   [facility] [place], Velna Hatchet, MD   5 years ago Tinea corporis   [facility] Danelle Berry, PA-C   5 years ago Allergic dermatitis   [facility] Dorena Bodo, PA-C   8 years ago Dysuria   [facility] Allayne Butcher B, PA-C   8 years ago Chronic neck pain   [facility] Pickard, Priscille Heidelberg, MD              Failed - Lipid Panel in normal range within the last 12 months    Cholesterol  Date Value Ref Range Status  03/18/2022 166 <200 mg/dL Final   LDL Cholesterol (Calc)  Date Value Ref Range Status  03/18/2022 101 (H) mg/dL (calc) Final    Comment:    Reference range: <100 . Desirable range <100 mg/dL for primary prevention;   <70 mg/dL for patients with CHD or diabetic patients  with > or = 2 CHD risk factors. Marland Kitchen LDL-C is now calculated using the Martin-Hopkins  calculation, which is a validated novel method providing  better accuracy than the Friedewald equation in the  estimation of LDL-C.  Horald Pollen et  al. Lenox Ahr. 1610;960(45): 2061-2068  (http://education.[facility].com/faq/FAQ164)    HDL  Date Value Ref Range Status  03/18/2022 45 (L) > OR = 50 mg/dL Final   Triglycerides  Date Value Ref Range Status  03/18/2022 104 <150 mg/dL Final         Passed - Patient is not pregnant

## 2023-07-17 ENCOUNTER — Other Ambulatory Visit (HOSPITAL_COMMUNITY): Payer: Self-pay

## 2023-07-17 MED ORDER — MOUNJARO 7.5 MG/0.5ML ~~LOC~~ SOAJ
7.5000 mg | SUBCUTANEOUS | 5 refills | Status: DC
  Filled 2023-07-17: qty 2, 28d supply, fill #0
  Filled 2023-08-28: qty 2, 28d supply, fill #1
  Filled 2023-09-26: qty 2, 28d supply, fill #2
  Filled 2023-10-27: qty 2, 28d supply, fill #3
  Filled 2023-11-27: qty 2, 28d supply, fill #4
  Filled 2023-12-28: qty 2, 28d supply, fill #5

## 2023-08-28 ENCOUNTER — Other Ambulatory Visit (HOSPITAL_COMMUNITY): Payer: Self-pay

## 2023-10-27 ENCOUNTER — Other Ambulatory Visit (HOSPITAL_COMMUNITY): Payer: Self-pay

## 2023-11-27 ENCOUNTER — Other Ambulatory Visit (HOSPITAL_COMMUNITY): Payer: Self-pay

## 2023-12-28 ENCOUNTER — Other Ambulatory Visit (HOSPITAL_COMMUNITY): Payer: Self-pay

## 2024-01-19 ENCOUNTER — Emergency Department (HOSPITAL_COMMUNITY)
Admission: EM | Admit: 2024-01-19 | Discharge: 2024-01-19 | Disposition: A | Discharge status: Against Medical Advice | Attending: Emergency Medicine | Admitting: Emergency Medicine

## 2024-01-19 ENCOUNTER — Emergency Department (HOSPITAL_BASED_OUTPATIENT_CLINIC_OR_DEPARTMENT_OTHER)
Admission: EM | Admit: 2024-01-19 | Discharge: 2024-01-19 | Disposition: A | Discharge status: Home / Self Care | Source: Home / Self Care | Attending: Emergency Medicine | Admitting: Emergency Medicine

## 2024-01-19 ENCOUNTER — Ambulatory Visit: Admitting: Family Medicine

## 2024-01-19 ENCOUNTER — Encounter (HOSPITAL_BASED_OUTPATIENT_CLINIC_OR_DEPARTMENT_OTHER): Payer: Self-pay | Admitting: Emergency Medicine

## 2024-01-19 ENCOUNTER — Other Ambulatory Visit: Payer: Self-pay

## 2024-01-19 ENCOUNTER — Emergency Department (HOSPITAL_BASED_OUTPATIENT_CLINIC_OR_DEPARTMENT_OTHER)

## 2024-01-19 ENCOUNTER — Encounter: Payer: Self-pay | Admitting: Family Medicine

## 2024-01-19 ENCOUNTER — Other Ambulatory Visit (HOSPITAL_BASED_OUTPATIENT_CLINIC_OR_DEPARTMENT_OTHER): Payer: Self-pay

## 2024-01-19 ENCOUNTER — Encounter (HOSPITAL_COMMUNITY): Payer: Self-pay | Admitting: Emergency Medicine

## 2024-01-19 VITALS — BP 188/76 | HR 95 | Temp 98.0°F | Ht 63.0 in | Wt 207.0 lb

## 2024-01-19 DIAGNOSIS — Z5321 Procedure and treatment not carried out due to patient leaving prior to being seen by health care provider: Secondary | ICD-10-CM | POA: Insufficient documentation

## 2024-01-19 DIAGNOSIS — R1032 Left lower quadrant pain: Secondary | ICD-10-CM | POA: Diagnosis present

## 2024-01-19 DIAGNOSIS — K5792 Diverticulitis of intestine, part unspecified, without perforation or abscess without bleeding: Secondary | ICD-10-CM | POA: Diagnosis not present

## 2024-01-19 DIAGNOSIS — K5732 Diverticulitis of large intestine without perforation or abscess without bleeding: Secondary | ICD-10-CM | POA: Insufficient documentation

## 2024-01-19 LAB — URINALYSIS, ROUTINE W REFLEX MICROSCOPIC
Bilirubin Urine: NEGATIVE
Glucose, UA: >=500 mg/dL — AB
Hgb urine dipstick: NEGATIVE
Ketones, ur: NEGATIVE mg/dL
Leukocytes,Ua: NEGATIVE
Nitrite: NEGATIVE
Protein, ur: NEGATIVE mg/dL
Specific Gravity, Urine: 1.02 (ref 1.005–1.030)
pH: 5 (ref 5.0–8.0)

## 2024-01-19 LAB — CBC
HCT: 40.5 % (ref 36.0–46.0)
Hemoglobin: 13.1 g/dL (ref 12.0–15.0)
MCH: 28.9 pg (ref 26.0–34.0)
MCHC: 32.3 g/dL (ref 30.0–36.0)
MCV: 89.2 fL (ref 80.0–100.0)
Platelets: 278 10*3/uL (ref 150–400)
RBC: 4.54 MIL/uL (ref 3.87–5.11)
RDW: 13.3 % (ref 11.5–15.5)
WBC: 13.3 10*3/uL — ABNORMAL HIGH (ref 4.0–10.5)
nRBC: 0 % (ref 0.0–0.2)

## 2024-01-19 LAB — COMPREHENSIVE METABOLIC PANEL WITH GFR
ALT: 17 U/L (ref 0–44)
AST: 16 U/L (ref 15–41)
Albumin: 3.7 g/dL (ref 3.5–5.0)
Alkaline Phosphatase: 36 U/L — ABNORMAL LOW (ref 38–126)
Anion gap: 10 (ref 5–15)
BUN: 11 mg/dL (ref 6–20)
CO2: 23 mmol/L (ref 22–32)
Calcium: 9.4 mg/dL (ref 8.9–10.3)
Chloride: 105 mmol/L (ref 98–111)
Creatinine, Ser: 0.78 mg/dL (ref 0.44–1.00)
GFR, Estimated: >60 mL/min (ref >60)
Glucose, Bld: 128 mg/dL — ABNORMAL HIGH (ref 70–99)
Potassium: 4.1 mmol/L (ref 3.5–5.1)
Sodium: 138 mmol/L (ref 135–145)
Total Bilirubin: 0.6 mg/dL (ref 0.0–1.2)
Total Protein: 6.9 g/dL (ref 6.5–8.1)

## 2024-01-19 LAB — LIPASE, BLOOD: Lipase: 55 U/L — ABNORMAL HIGH (ref 11–51)

## 2024-01-19 LAB — HCG, SERUM, QUALITATIVE: Preg, Serum: NEGATIVE

## 2024-01-19 MED ORDER — FLUCONAZOLE 200 MG PO TABS
200.0000 mg | ORAL_TABLET | Freq: Every day | ORAL | 0 refills | Status: DC | PRN
  Filled 2024-01-19: qty 1, 1d supply, fill #0

## 2024-01-19 MED ORDER — METRONIDAZOLE 500 MG PO TABS
500.0000 mg | ORAL_TABLET | Freq: Once | ORAL | Status: AC
Start: 2024-01-19 — End: 2024-01-19
  Administered 2024-01-19: 500 mg via ORAL
  Filled 2024-01-19: qty 1

## 2024-01-19 MED ORDER — AMOXICILLIN-POT CLAVULANATE 875-125 MG PO TABS: 1.0000 | ORAL_TABLET | Freq: Two times a day (BID) | ORAL | 0 refills | Status: DC

## 2024-01-19 MED ORDER — IOHEXOL 300 MG/ML  SOLN
100.0000 mL | Freq: Once | INTRAMUSCULAR | Status: AC | PRN
  Administered 2024-01-19: 100 mL via INTRAVENOUS

## 2024-01-19 MED ORDER — CIPROFLOXACIN HCL 500 MG PO TABS
500.0000 mg | ORAL_TABLET | Freq: Two times a day (BID) | ORAL | 0 refills | Status: AC
  Filled 2024-01-19: qty 14, 7d supply, fill #0

## 2024-01-19 MED ORDER — METRONIDAZOLE 500 MG PO TABS
500.0000 mg | ORAL_TABLET | Freq: Three times a day (TID) | ORAL | 0 refills | Status: AC
  Filled 2024-01-19: qty 21, 7d supply, fill #0

## 2024-01-19 MED ORDER — CIPROFLOXACIN HCL 500 MG PO TABS
500.0000 mg | ORAL_TABLET | Freq: Once | ORAL | Status: AC
  Administered 2024-01-19: 500 mg via ORAL
  Filled 2024-01-19: qty 1

## 2024-01-19 NOTE — ED Notes (Addendum)
 Pt decided to leave after making appt with primary doctor.

## 2024-01-19 NOTE — Discharge Instructions (Addendum)
 Please do not take the Augmentin  that was prescribed by your primary care doctor.  We decided to stop that medicine because of concern for possible side effects.  We switched you to a different antibiotic combination with ciprofloxacin  and Flagyl .  Take these new antibiotics as prescribed.

## 2024-01-19 NOTE — ED Triage Notes (Signed)
 Pt caox4, ambulatory c/o abd pain for approx 1 wk. Recently stopped mounjaro . Pt states she was at [facility] ED and [facility] and went to see her PCP who recommended she go to ED for scan d/t concern for diverticulitis. Last took Tylenol at approx 0700.

## 2024-01-19 NOTE — Progress Notes (Signed)
 Subjective:    Patient ID: Mary Conley, female    DOB: 11-11-1970, 53 y.o.   MRN: [id number]  Abdominal Pain  Patient reports left-sided abdominal pain intermittently over the last 3 weeks.  Over the last few days the pain has become severe and unrelenting.  The pain was so bad last night she went to the emergency room.  She left after waiting 8 hours.  In the emergency room lipase was mildly elevated.  White count was elevated.  Urinalysis showed glucosuria as 1 would expect for her being on Farxiga.  Today on exam, she has guarding in the left lower quadrant.  She has left-sided CVA tenderness.  She reports intense pain with palpation.  She also has rebound pain.  I am concerned that the patient has peritonitis and I suspect that she has diverticulitis.  She denies any vomiting.  She denies any melena.  She denies any dysuria or hematuria. Past Medical History:  Diagnosis Date   Chest pain    Diabetes mellitus without complication (HCC)    GERD (gastroesophageal reflux disease)    Hypercholesteremia    Past Surgical History:  Procedure Laterality Date   CESAREAN SECTION     Current Outpatient Medications on File Prior to Visit  Medication Sig Dispense Refill   azithromycin  (ZITHROMAX ) 250 MG tablet Take 1 tablet (250 mg total) by mouth daily. Take first 2 tablets together, then 1 every day until finished. (Patient not taking: Reported on 03/25/2022) 6 tablet 0   benzonatate  (TESSALON ) 100 MG capsule Take 1 capsule (100 mg total) by mouth every 8 (eight) hours. (Patient not taking: Reported on 03/25/2022) 21 capsule 0   cetirizine  (ZYRTEC  ALLERGY) 10 MG tablet Take 1 tablet (10 mg total) by mouth daily. (Patient not taking: Reported on 03/25/2022) 30 tablet 0   Dapagliflozin-metFORMIN HCl ER 10-500 MG TB24 Take by mouth. Dr. Norman Beckmann Dr)     Dulaglutide  (TRULICITY ) 0.75 MG/0.5ML SOPN Inject 0.75 mg into the skin once a week. 2 mL 5   Dulaglutide  (TRULICITY ) 1.5 MG/0.5ML SOPN Inject  into the skin. Dr. Balan     Dulaglutide  (TRULICITY ) 1.5 MG/0.5ML SOPN INJECT 1.5 MG INTO THE SKIN ONCE A WEEK 2 mL 6   meloxicam  (MOBIC ) 15 MG tablet Take 1 tablet (15 mg total) by mouth daily. (Patient not taking: Reported on 03/25/2022) 30 tablet 1   metFORMIN (GLUCOPHAGE-XR) 500 MG 24 hr tablet Take 1,000 mg by mouth 2 (two) times daily. (Patient not taking: Reported on 03/25/2022)     methocarbamol  (ROBAXIN ) 500 MG tablet Take 1 tablet (500 mg total) by mouth 2 (two) times daily. (Patient not taking: Reported on 03/25/2022) 20 tablet 0   predniSONE  (STERAPRED UNI-PAK 21 TAB) 10 MG (21) TBPK tablet Take 6 tabs by mouth daily  for 2 days, then 5 tabs for 2 days, then 4 tabs for 2 days, then 3 tabs for 2 days, 2 tabs for 2 days, then 1 tab by mouth daily for 2 days (Patient not taking: Reported on 03/25/2022) 42 tablet 0   rosuvastatin  (CRESTOR ) 10 MG tablet TAKE 1 TABLET BY MOUTH EVERY DAY 90 tablet 0   tirzepatide  (MOUNJARO ) 7.5 MG/0.5ML Pen Inject 7.5 mg into the skin once a week. 2 mL 5   No current facility-administered medications on file prior to visit.   Allergies  Allergen Reactions   Prednisone  Other (See Comments)    Flushing   Social History   Socioeconomic History   Marital status: Married  Spouse name: Not on file   Number of children: 2   Years of education: Not on file   Highest education level: Not on file  Occupational History   Occupation: Runner, broadcasting/film/video / coach  Tobacco Use   Smoking status: Never   Smokeless tobacco: Never  Substance and Sexual Activity   Alcohol use: No   Drug use: Not on file   Sexual activity: Not on file  Other Topics Concern   Not on file  Social History Narrative   Not on file   Social Drivers of Health   Financial Resource Strain: Not on file  Food Insecurity: Not on file  Transportation Needs: Not on file  Physical Activity: Not on file  Stress: Not on file  Social Connections: Not on file  Intimate Partner Violence: Not on file    Family History  Problem Relation Age of Onset   Heart attack Other    Hypertension Other    Diabetes Other       Review of Systems  Gastrointestinal:  Positive for abdominal pain.  All other systems reviewed and are negative.      Objective:   Physical Exam Vitals reviewed.  Constitutional:      General: She is not in acute distress.    Appearance: Normal appearance. She is obese. She is not ill-appearing, toxic-appearing or diaphoretic.  HENT:     Head: Normocephalic and atraumatic.     Right Ear: Tympanic membrane and ear canal normal.     Left Ear: Tympanic membrane and ear canal normal.     Nose: Nose normal. No congestion or rhinorrhea.     Mouth/Throat:     Pharynx: No posterior oropharyngeal erythema.  Eyes:     Conjunctiva/sclera: Conjunctivae normal.  Neck:     Vascular: No carotid bruit.  Cardiovascular:     Rate and Rhythm: Normal rate and regular rhythm.     Pulses: Normal pulses.     Heart sounds: Normal heart sounds. No murmur heard.    No friction rub. No gallop.  Pulmonary:     Effort: Pulmonary effort is normal. No respiratory distress.     Breath sounds: Normal breath sounds. No stridor. No wheezing, rhonchi or rales.  Abdominal:     General: Abdomen is flat. Bowel sounds are normal. There is no distension.     Palpations: Abdomen is soft.     Tenderness: There is abdominal tenderness in the left lower quadrant. There is guarding and rebound.  Musculoskeletal:     Cervical back: Normal range of motion. No rigidity.     Right lower leg: No edema.     Left lower leg: No edema.  Lymphadenopathy:     Cervical: No cervical adenopathy.  Skin:    General: Skin is warm.     Coloration: Skin is not jaundiced or pale.     Findings: No bruising, erythema, lesion or rash.  Neurological:     General: No focal deficit present.     Mental Status: She is alert and oriented to person, place, and time.     Cranial Nerves: No cranial nerve deficit.      Motor: No weakness.     Coordination: Coordination normal.     Gait: Gait normal.  Psychiatric:        Mood and Affect: Mood normal.        Behavior: Behavior normal.        Thought Content: Thought content normal.  Judgment: Judgment normal.           Assessment & Plan:  Diverticulitis I suspect the patient has diverticulitis.  Her exam is bordering on an acute abdomen.  Patient can start Augmentin  875 mg twice daily for 10 days.  However if pain is worsening, she needs to go directly to the emergency room for imaging/CT scan to evaluate for causes of acute abdomen or ruptured diverticuli.  Patient reports understanding and agrees with plan

## 2024-01-19 NOTE — ED Provider Notes (Signed)
 [facility] Provider Note   CSN: [id number] Arrival date & time: 01/19/24  1127     History  Chief Complaint  Patient presents with   Abdominal Pain    Mary Conley is a 53 y.o. female presented to ED with left lower quadrant abdominal pain.  Patient says she has had on and off abdominal pain, cramping and diarrhea for about 1 month, switched off of Mounjaro  at that time.  She says that the pain is worse in the left lower side for the past several days.  She went to the ED last night but left a prolonged waiting times and went to see her PCP in the office, was concerned that she may have diverticulitis with complication told her to come back for CT.  She reports nausea, denies fevers or chills, denies active diarrhea or constipation.  HPI     Home Medications Prior to Admission medications   Medication Sig Start Date End Date Taking? Authorizing Provider  ciprofloxacin  (CIPRO ) 500 MG tablet Take 1 tablet (500 mg total) by mouth every 12 (twelve) hours for 7 days. 01/19/24 01/26/24 Yes Arlyn Buerkle, Janalyn Me, MD  fluconazole  (DIFLUCAN ) 200 MG tablet Take 1 tablet (200 mg total) by mouth daily as needed for up to 1 dose. 01/19/24  Yes Torre Pikus, Janalyn Me, MD  metroNIDAZOLE  (FLAGYL ) 500 MG tablet Take 1 tablet (500 mg total) by mouth 3 (three) times daily for 7 days. 01/19/24 01/26/24 Yes Ciearra Rufo, Janalyn Me, MD  azithromycin  (ZITHROMAX ) 250 MG tablet Take 1 tablet (250 mg total) by mouth daily. Take first 2 tablets together, then 1 every day until finished. Patient not taking: Reported on 01/19/2024 01/14/20   Avegno, Komlanvi S, FNP  benzonatate  (TESSALON ) 100 MG capsule Take 1 capsule (100 mg total) by mouth every 8 (eight) hours. Patient not taking: Reported on 01/19/2024 01/14/20   Avegno, Komlanvi S, FNP  cetirizine  (ZYRTEC  ALLERGY) 10 MG tablet Take 1 tablet (10 mg total) by mouth daily. Patient not taking: Reported on 01/19/2024 01/14/20   Avegno,  Komlanvi S, FNP  Dapagliflozin-metFORMIN HCl ER 10-500 MG TB24 Take by mouth. Dr. Norman Beckmann Dr) Patient not taking: Reported on 01/19/2024    [provider]  Dulaglutide  (TRULICITY ) 0.75 MG/0.5ML SOPN Inject 0.75 mg into the skin once a week. 11/15/22     Dulaglutide  (TRULICITY ) 1.5 MG/0.5ML SOPN Inject into the skin. Dr. Ronelle Coffee    [provider]  Dulaglutide  (TRULICITY ) 1.5 MG/0.5ML SOPN INJECT 1.5 MG INTO THE SKIN ONCE A WEEK 10/10/22     meloxicam  (MOBIC ) 15 MG tablet Take 1 tablet (15 mg total) by mouth daily. 09/04/19   Mathis Som, MD  metFORMIN (GLUCOPHAGE-XR) 500 MG 24 hr tablet Take 1,000 mg by mouth 2 (two) times daily.    [provider]  methocarbamol  (ROBAXIN ) 500 MG tablet Take 1 tablet (500 mg total) by mouth 2 (two) times daily. Patient not taking: Reported on 01/19/2024 09/04/19   Mathis Som, MD  predniSONE  (STERAPRED UNI-PAK 21 TAB) 10 MG (21) TBPK tablet Take 6 tabs by mouth daily  for 2 days, then 5 tabs for 2 days, then 4 tabs for 2 days, then 3 tabs for 2 days, 2 tabs for 2 days, then 1 tab by mouth daily for 2 days Patient not taking: Reported on 01/19/2024 01/14/20   Avegno, Komlanvi S, FNP  rosuvastatin  (CRESTOR ) 10 MG tablet TAKE 1 TABLET BY MOUTH EVERY DAY 03/29/23   Eliane Grooms  T, MD  tirzepatide  (MOUNJARO ) 7.5 MG/0.5ML Pen Inject 7.5 mg into the skin once a week. 07/17/23         Allergies    Prednisone     Review of Systems   Review of Systems  Physical Exam Updated Vital Signs BP 107/76   Pulse 79   Temp 97.7 F (36.5 C)   Resp 18   SpO2 100%  Physical Exam Constitutional:      General: She is not in acute distress. HENT:     Head: Normocephalic and atraumatic.  Eyes:     Conjunctiva/sclera: Conjunctivae normal.     Pupils: Pupils are equal, round, and reactive to light.  Cardiovascular:     Rate and Rhythm: Normal rate and regular rhythm.  Pulmonary:     Effort: Pulmonary effort is normal. No respiratory  distress.  Abdominal:     General: There is no distension.     Tenderness: There is abdominal tenderness in the left lower quadrant.  Skin:    General: Skin is warm and dry.  Neurological:     General: No focal deficit present.     Mental Status: She is alert. Mental status is at baseline.  Psychiatric:        Mood and Affect: Mood normal.        Behavior: Behavior normal.     ED Results / Procedures / Treatments   Labs (all labs ordered are listed, but only abnormal results are displayed) Labs Reviewed - No data to display  EKG None  Radiology CT ABDOMEN PELVIS W CONTRAST Result Date: 01/19/2024 CLINICAL DATA:  Abdominal pain. EXAM: CT ABDOMEN AND PELVIS WITH CONTRAST TECHNIQUE: Multidetector CT imaging of the abdomen and pelvis was performed using the standard protocol following bolus administration of intravenous contrast. RADIATION DOSE REDUCTION: This exam was performed according to the departmental dose-optimization program which includes automated exposure control, adjustment of the mA and/or kV according to patient size and/or use of iterative reconstruction technique. CONTRAST:  OMNIPAQUE  IOHEXOL  300 MG/ML  SOLN COMPARISON:  CT abdomen pelvis dated 05/01/2019. FINDINGS: Lower chest: The visualized lung bases are clear. No intra-abdominal free air.  Small free fluid in the pelvis. Hepatobiliary: Fatty liver. No biliary dilatation. The gallbladder is unremarkable. Pancreas: Unremarkable. No pancreatic ductal dilatation or surrounding inflammatory changes. Spleen: Normal in size without focal abnormality. Adrenals/Urinary Tract: The adrenal glands unremarkable there is no hydronephrosis on either side. There is symmetric enhancement and excretion of contrast by both kidneys. The visualized ureters and urinary bladder appear unremarkable. Stomach/Bowel: Scattered colonic diverticula. There is inflammatory changes centered at a mid descending colon diverticula consistent with acute  diverticulitis. No diverticular abscess or perforation. There is no bowel obstruction. The appendix is normal. Vascular/Lymphatic: Mild aortoiliac atherosclerotic disease. The IVC is unremarkable. No portal venous gas. There is no adenopathy. Reproductive: The uterus is anteverted and grossly unremarkable. No suspicious adnexal masses. Other: None Musculoskeletal: No acute or significant osseous findings. IMPRESSION: 1. Acute descending colon diverticulitis. No diverticular abscess or perforation. 2. Fatty liver. 3.  Aortic Atherosclerosis (ICD10-I70.0). Electronically Signed   By: Angus Bark M.D.   On: 01/19/2024 13:04    Procedures Procedures    Medications Ordered in ED Medications  iohexol  (OMNIPAQUE ) 300 MG/ML solution 100 mL (100 mLs Intravenous Contrast Given 01/19/24 1228)  ciprofloxacin  (CIPRO ) tablet 500 mg (500 mg Oral Given 01/19/24 1355)  metroNIDAZOLE  (FLAGYL ) tablet 500 mg (500 mg Oral Given 01/19/24 1355)    ED Course/ Medical Decision  Making/ A&P                                 Medical Decision Making Amount and/or Complexity of Data Reviewed Radiology: ordered.  Risk Prescription drug management.   Patient is presenting left lower sided abdominal pain.  Differential include diverticulitis versus colitis versus constipation versus other  I reviewed the patient's external workup as well as labs performed earlier this morning in the emergency department.  She had a minor leukocytosis white blood cell count 13.3.  CMP and lipase unremarkable (very mild nonspecific lipase elevation); UA without evidence of infection  CT abdomen pelvis was ordered here with contrast.  I personally reviewed and interpreted this imaging, and agree with the radiologist report, this is an acute uncomplicated sigmoid diverticulitis without evidence of perforation.  The patient was prescribed Augmentin  by her PCP and has filled the prescription but not started taking it yet.  She expresses  concerns about GI side effects reports she had bad GI issues with Augmentin  in the past.  As an alternative we will start her on ciprofloxacin  and Flagyl , which may cause less GI issues.  She and her husband are in agreement with the plan.  At this time she is stable for discharge.        Final Clinical Impression(s) / ED Diagnoses Final diagnoses:  Diverticulitis    Rx / DC Orders ED Discharge Orders          Ordered    metroNIDAZOLE  (FLAGYL ) 500 MG tablet  3 times daily        01/19/24 1348    ciprofloxacin  (CIPRO ) 500 MG tablet  Every 12 hours        01/19/24 1348    fluconazole  (DIFLUCAN ) 200 MG tablet  Daily PRN        01/19/24 1348              Arvilla Birmingham, MD 01/19/24 1505

## 2024-01-19 NOTE — ED Triage Notes (Signed)
 Pt reports lower left sided 10/10 sharp abd pn.  She states she has not had a regular bowel movement in a while.  This has had happened three times in the past few months.  Pt hurts so much she is walking bended over.

## 2024-02-04 ENCOUNTER — Encounter: Payer: Self-pay | Admitting: Family Medicine

## 2024-02-05 MED ORDER — FLUCONAZOLE 200 MG PO TABS: 200.0000 mg | ORAL_TABLET | Freq: Every day | ORAL | 0 refills | Status: DC | PRN

## 2024-02-05 NOTE — Telephone Encounter (Signed)
 Copied from CRM [id number]. Topic: Clinical - Prescription Issue >> Feb 05, 2024  9:08 AM Donald Frost wrote: Reason for CRM: The patient saw her provider recently and was sent to the ER at [facility].  It was due to diverticulitis. It was here that she was prescribed fluconazole  (DIFLUCAN ) 200 MG tablet due to a possible yeast infection and that is why she was prescribed this 1 pill but unfortunately she left it at home and [place] to [place] daughters graduation! Please assist her further by calling the medicine into the local pharmacy there   [id number] - [place], [place] - [address] AT [facility] [address] Phone: [phone] Fax: [phone]  Please assist patient further

## 2024-02-12 ENCOUNTER — Encounter: Payer: Self-pay | Admitting: Family Medicine

## 2024-02-12 ENCOUNTER — Ambulatory Visit: Payer: Self-pay

## 2024-02-12 ENCOUNTER — Ambulatory Visit: Admitting: Family Medicine

## 2024-02-12 ENCOUNTER — Ambulatory Visit: Payer: Self-pay | Admitting: Family Medicine

## 2024-02-12 VITALS — BP 124/84 | HR 80 | Temp 98.4°F | Ht 63.0 in | Wt 209.0 lb

## 2024-02-12 DIAGNOSIS — R350 Frequency of micturition: Secondary | ICD-10-CM

## 2024-02-12 DIAGNOSIS — R109 Unspecified abdominal pain: Secondary | ICD-10-CM | POA: Diagnosis not present

## 2024-02-12 DIAGNOSIS — Z8719 Personal history of other diseases of the digestive system: Secondary | ICD-10-CM | POA: Diagnosis not present

## 2024-02-12 LAB — COMPREHENSIVE METABOLIC PANEL WITH GFR
AG Ratio: 1.7 (calc) (ref 1.0–2.5)
ALT: 21 U/L (ref 6–29)
AST: 15 U/L (ref 10–35)
Albumin: 4.5 g/dL (ref 3.6–5.1)
Alkaline phosphatase (APISO): 38 U/L (ref 37–153)
BUN: 11 mg/dL (ref 7–25)
CO2: 30 mmol/L (ref 20–32)
Calcium: 9.7 mg/dL (ref 8.6–10.4)
Chloride: 103 mmol/L (ref 98–110)
Creat: 0.61 mg/dL (ref 0.50–1.03)
Globulin: 2.7 g/dL (ref 1.9–3.7)
Glucose, Bld: 131 mg/dL — ABNORMAL HIGH (ref 65–99)
Potassium: 4.5 mmol/L (ref 3.5–5.3)
Sodium: 138 mmol/L (ref 135–146)
Total Bilirubin: 0.4 mg/dL (ref 0.2–1.2)
Total Protein: 7.2 g/dL (ref 6.1–8.1)
eGFR: 109 mL/min/1.73m2

## 2024-02-12 LAB — URINALYSIS, ROUTINE W REFLEX MICROSCOPIC
Bacteria, UA: NONE SEEN /HPF
Bilirubin Urine: NEGATIVE
Hgb urine dipstick: NEGATIVE
Hyaline Cast: NONE SEEN /LPF
Ketones, ur: NEGATIVE
Nitrite: NEGATIVE
Protein, ur: NEGATIVE
Specific Gravity, Urine: 1.02 (ref 1.001–1.035)
pH: 5.5 (ref 5.0–8.0)

## 2024-02-12 LAB — CBC WITH DIFFERENTIAL/PLATELET
Absolute Lymphocytes: 1586 {cells}/uL (ref 850–3900)
Absolute Monocytes: 501 {cells}/uL (ref 200–950)
Basophils Absolute: 46 {cells}/uL (ref 0–200)
Basophils Relative: 0.6 %
Eosinophils Absolute: 177 {cells}/uL (ref 15–500)
Eosinophils Relative: 2.3 %
HCT: 40.4 % (ref 35.0–45.0)
Hemoglobin: 13.3 g/dL (ref 11.7–15.5)
MCH: 28.9 pg (ref 27.0–33.0)
MCHC: 32.9 g/dL (ref 32.0–36.0)
MCV: 87.6 fL (ref 80.0–100.0)
MPV: 10.1 fL (ref 7.5–12.5)
Monocytes Relative: 6.5 %
Neutro Abs: 5390 {cells}/uL (ref 1500–7800)
Neutrophils Relative %: 70 %
Platelets: 277 Thousand/uL (ref 140–400)
RBC: 4.61 Million/uL (ref 3.80–5.10)
RDW: 12.5 % (ref 11.0–15.0)
Total Lymphocyte: 20.6 %
WBC: 7.7 Thousand/uL (ref 3.8–10.8)

## 2024-02-12 LAB — MICROSCOPIC MESSAGE

## 2024-02-12 MED ORDER — DICYCLOMINE HCL 10 MG PO CAPS: 10.0000 mg | ORAL_CAPSULE | Freq: Three times a day (TID) | ORAL | 1 refills | Status: DC

## 2024-02-12 NOTE — Telephone Encounter (Signed)
  Chief Complaint: left flank pain Symptoms: left flank pain radiating to back Frequency: x one week Pertinent Negatives: Patient denies nausea/vomiting/fever Disposition: [] ED /[] Urgent Care (no appt availability in office) / [] Appointment(In office/virtual)/ []  [facility]/ [] Home Care/ [] Refused Recommended Disposition /[] [facility]/ [x]  Follow-up with PCP Additional Notes: CAL called to secure appointment today for flank pain.  Flank pain x 1 week unrelieved by medication Copied from CRM #[id number]. Topic: Clinical - Red Word Triage >> Feb 12, 2024  8:33 AM Turkey B wrote: [facility] that prompted transfer to Nurse Triage: patient having severe pain on left side Reason for Disposition  MODERATE pain (e.g., interferes with normal activities or awakens from sleep)  Answer Assessment - Initial Assessment Questions 1. LOCATION: "Where does it hurt?" (e.g., left, right)     Left flank pain, radiating to back 2. ONSET: "When did the pain start?"     One week ago 3. SEVERITY: "How bad is the pain?" (e.g., Scale 1-10; mild, moderate, or severe)   - MILD (1-3): doesn't interfere with normal activities    - MODERATE (4-7): interferes with normal activities or awakens from sleep    - SEVERE (8-10): excruciating pain and patient unable to do normal activities (stays in bed)       7/10, worse when laying down 4. PATTERN: "Does the pain come and go, or is it constant?"      constant 5. CAUSE: "What do you think is causing the pain?"     Possible diverticulitis flare 6. OTHER SYMPTOMS:  "Do you have any other symptoms?" (e.g., fever, abdomen pain, vomiting, leg weakness, burning with urination, blood in urine)     Abdominal pain, one incidence last week of itching with urination  Protocols used: Flank Pain-A-AH

## 2024-02-12 NOTE — Progress Notes (Signed)
 Patient Office Visit  Assessment & Plan:   Urinary frequency -     Urinalysis, Routine w reflex microscopic -     Microscopic Message -     Urine Culture; Future -     Comprehensive metabolic panel with GFR -     CBC with Differential/Platelet  History of diverticulitis -     Comprehensive metabolic panel with GFR -     CBC with Differential/Platelet -     Dicyclomine  HCl; Take 1 capsule (10 mg total) by mouth 4 (four) times daily -  before meals and at bedtime. As needed for abdominal cramps  Dispense: 30 capsule; Refill: 1  Acute left flank pain -     Urine Culture; Future  UTI vs possible acute diverticulitis without perforation Recurrent abdominal pain with acute diverticulitis diagnosed on January 21, 2024. Differential includes recurrent diverticulitis, UTI, or nephrolithiasis. Augmentin  may alter symptom presentation. Blood work preferred before CT to avoid unnecessary contrast. - Order blood work r/o infection vs other - Order urine culture to rule out UTI. - Continue Augmentin  for now.  - Advise monitoring symptoms and seek emergency care if worsens.  Fatty liver Incidental finding of fatty liver on previous CT scan. - Advise on lifestyle modifications including diet and weight loss.   No follow-ups on file.   Subjective:     Patient ID: Mary Conley, female    DOB: Dec 16, 1970  Age: 53 y.o. MRN: [id number]  Chief Complaint  Patient presents with   Back Pain    Left lower back pain x 4 days.     Back Pain   Discussed the use of AI scribe software for clinical note transcription with the patient, who gave verbal consent to proceed.  History of Present Illness        History of Present Illness Mary Conley is a 53 year old female with diverticulitis who presents with persistent back pain, possible UTI vs diverticulitis.  She has been experiencing severe left sided back pain since Thursday, Feb 08, 2024, which began while at [employer]. The pain is  sharp, constant, and primarily located on the left side of her back, worsening when lying down. She rates the pain as 7 or 8 out of 10, increasing to 9 or 10 when lying down. No fever, chills, or vomiting, but she feels nauseous at times. Patient is nervous about this because she ended up in the ER last month with diverticulitis.   She has a history of acute diverticulitis diagnosed on January 21, 2024, after presenting with severe abdominal pain. A CT scan at that time showed diverticulitis without perforation. She was treated with Cipro  and Flagyl , which improved her symptoms. Initially prescribed Augmentin , she was switched to Cipro  and Flagyl  due to concerns about stomach tolerance. Recently, she resumed taking Augmentin  on Saturday, Feb 10, 2024, after experiencing worsening back pain, but reports no significant improvement after four doses. The current pain is different from her previous diverticulitis pain, which was more anterior. This is more left sided flank pain  She has a history of type 2 diabetes and was previously on Mounjaro  for about a year, which was stopped due to concerns it might be contributing to her GI symptoms. She is currently not on any diabetes medication but plans to restart metformin once her stomach issues resolve.  She reports normal bowel movements without diarrhea or constipation, although she has been eating less due to fear of exacerbating her symptoms. She has  been trying to maintain hydration and has been consuming yogurt to help with her stomach. No history of kidney stones and no blood in her urine. She has been experiencing discomfort in her lower abdomen, which she attributes to the Augmentin , but has not had loose stools. Physical Exam ABDOMEN: Abdomen soft with mild tenderness. No tenderness to touch in the upper abdomen. Pain more in the back than the front. Results LABS Urinalysis: Trace bacteria (02/12/2024)  RADIOLOGY Abdominal CT: Acute diverticulitis  without perforation (01/21/2024) Assessment & Plan Discussion of acute diverticulitis without perforation Recurrent abdominal pain with acute diverticulitis diagnosed on January 21, 2024. Differential includes recurrent diverticulitis, UTI, or nephrolithiasis. Augmentin  may alter symptom presentation. Blood work preferred before CT to avoid unnecessary contrast. - Order blood work for infection or inflammation. - Order urine culture to rule out UTI. - Continue Augmentin . - Advise monitoring symptoms and seek emergency care if worsens.  Fatty liver Incidental finding of fatty liver on previous CT scan. Reduce fats in diet   The 10-year ASCVD risk score (Arnett DK, et al., 2019) is: 2.2%  Past Medical History:  Diagnosis Date   Chest pain    Diabetes mellitus without complication (HCC)    GERD (gastroesophageal reflux disease)    Hypercholesteremia    Past Surgical History:  Procedure Laterality Date   CESAREAN SECTION     Social History   Tobacco Use   Smoking status: Never   Smokeless tobacco: Never  Substance Use Topics   Alcohol use: No   Family History  Problem Relation Age of Onset   Heart attack Other    Hypertension Other    Diabetes Other    Allergies  Allergen Reactions   Prednisone  Other (See Comments)    Flushing    Review of Systems  Musculoskeletal:  Positive for back pain.      Objective:    BP 124/84   Pulse 80   Temp 98.4 F (36.9 C)   Ht 5\' 3"  (1.6 m)   Wt 209 lb (94.8 kg)   SpO2 99%   BMI 37.02 kg/m  BP Readings from Last 3 Encounters:  02/12/24 124/84  01/19/24 107/76  01/19/24 (!) 188/76   Wt Readings from Last 3 Encounters:  02/12/24 209 lb (94.8 kg)  01/19/24 207 lb (93.9 kg)  03/25/22 225 lb (102.1 kg)    Physical Exam Vitals and nursing note reviewed.  Constitutional:      Appearance: Normal appearance.  HENT:     Head: Normocephalic.     Right Ear: Tympanic membrane, ear canal and external ear normal.     Left Ear:  Tympanic membrane, ear canal and external ear normal.  Eyes:     Extraocular Movements: Extraocular movements intact.     Pupils: Pupils are equal, round, and reactive to light.  Cardiovascular:     Rate and Rhythm: Normal rate and regular rhythm.     Heart sounds: Normal heart sounds.  Pulmonary:     Effort: Pulmonary effort is normal.     Breath sounds: Normal breath sounds.  Abdominal:     Tenderness: There is abdominal tenderness. There is left CVA tenderness. There is no right CVA tenderness.  Musculoskeletal:     Right lower leg: No edema.     Left lower leg: No edema.  Neurological:     General: No focal deficit present.     Mental Status: She is alert and oriented to person, place, and time.  Psychiatric:  Mood and Affect: Mood normal.        Behavior: Behavior normal.      Results for orders placed or performed in visit on 02/12/24  Urinalysis, Routine w reflex microscopic  Result Value Ref Range   Color, Urine YELLOW YELLOW   APPearance CLEAR CLEAR   Specific Gravity, Urine 1.020 1.001 - 1.035   pH 5.5 5.0 - 8.0   Glucose, UA TRACE (A) NEGATIVE   Bilirubin Urine NEGATIVE NEGATIVE   Ketones, ur NEGATIVE NEGATIVE   Hgb urine dipstick NEGATIVE NEGATIVE   Protein, ur NEGATIVE NEGATIVE   Nitrite NEGATIVE NEGATIVE   Leukocytes,Ua TRACE (A) NEGATIVE   WBC, UA 0-5 0 - 5 /HPF   RBC / HPF 0-2 0 - 2 /HPF   Squamous Epithelial / HPF 0-5 < OR = 5 /HPF   Bacteria, UA NONE SEEN NONE SEEN /HPF   Hyaline Cast NONE SEEN NONE SEEN /LPF  Microscopic Message  Result Value Ref Range   Note    Comprehensive metabolic panel with GFR  Result Value Ref Range   Glucose, Bld 131 (H) 65 - 99 mg/dL   BUN 11 7 - 25 mg/dL   Creat 1.61 0.96 - 0.45 mg/dL   eGFR 409 > OR = 60 WJ/XBJ/4.78G9   BUN/Creatinine Ratio SEE NOTE: 6 - 22 (calc)   Sodium 138 135 - 146 mmol/L   Potassium 4.5 3.5 - 5.3 mmol/L   Chloride 103 98 - 110 mmol/L   CO2 30 20 - 32 mmol/L   Calcium  9.7 8.6 - 10.4  mg/dL   Total Protein 7.2 6.1 - 8.1 g/dL   Albumin 4.5 3.6 - 5.1 g/dL   Globulin 2.7 1.9 - 3.7 g/dL (calc)   AG Ratio 1.7 1.0 - 2.5 (calc)   Total Bilirubin 0.4 0.2 - 1.2 mg/dL   Alkaline phosphatase (APISO) 38 37 - 153 U/L   AST 15 10 - 35 U/L   ALT 21 6 - 29 U/L  CBC with Differential/Platelet  Result Value Ref Range   WBC 7.7 3.8 - 10.8 Thousand/uL   RBC 4.61 3.80 - 5.10 Million/uL   Hemoglobin 13.3 11.7 - 15.5 g/dL   HCT 56.2 13.0 - 86.5 %   MCV 87.6 80.0 - 100.0 fL   MCH 28.9 27.0 - 33.0 pg   MCHC 32.9 32.0 - 36.0 g/dL   RDW 78.4 69.6 - 29.5 %   Platelets 277 140 - 400 Thousand/uL   MPV 10.1 7.5 - 12.5 fL   Neutro Abs 5,390 1,500 - 7,800 cells/uL   Absolute Lymphocytes 1,586 850 - 3,900 cells/uL   Absolute Monocytes 501 200 - 950 cells/uL   Eosinophils Absolute 177 15 - 500 cells/uL   Basophils Absolute 46 0 - 200 cells/uL   Neutrophils Relative % 70 %   Total Lymphocyte 20.6 %   Monocytes Relative 6.5 %   Eosinophils Relative 2.3 %   Basophils Relative 0.6 %

## 2024-02-13 LAB — URINE CULTURE
MICRO NUMBER:: 16472477
Result:: NO GROWTH
SPECIMEN QUALITY:: ADEQUATE

## 2024-02-15 ENCOUNTER — Ambulatory Visit: Admitting: Family Medicine

## 2024-05-28 LAB — LAB REPORT - SCANNED
A1c: 6.8
EGFR: 98

## 2024-06-07 ENCOUNTER — Encounter: Payer: Self-pay | Admitting: Family Medicine

## 2024-06-07 ENCOUNTER — Ambulatory Visit: Admitting: Family Medicine

## 2024-06-07 VITALS — BP 124/76 | HR 84 | Temp 97.9°F | Ht 63.0 in | Wt 214.0 lb

## 2024-06-07 DIAGNOSIS — R748 Abnormal levels of other serum enzymes: Secondary | ICD-10-CM

## 2024-06-07 LAB — CBC WITH DIFFERENTIAL/PLATELET
Absolute Lymphocytes: 1449 {cells}/uL (ref 850–3900)
Absolute Monocytes: 338 {cells}/uL (ref 200–950)
Basophils Absolute: 41 {cells}/uL (ref 0–200)
Basophils Relative: 0.6 %
Eosinophils Absolute: 83 {cells}/uL (ref 15–500)
Eosinophils Relative: 1.2 %
HCT: 40.2 % (ref 35.0–45.0)
Hemoglobin: 13.2 g/dL (ref 11.7–15.5)
MCH: 28.4 pg (ref 27.0–33.0)
MCHC: 32.8 g/dL (ref 32.0–36.0)
MCV: 86.6 fL (ref 80.0–100.0)
MPV: 10.2 fL (ref 7.5–12.5)
Monocytes Relative: 4.9 %
Neutro Abs: 4989 {cells}/uL (ref 1500–7800)
Neutrophils Relative %: 72.3 %
Platelets: 258 Thousand/uL (ref 140–400)
RBC: 4.64 Million/uL (ref 3.80–5.10)
RDW: 12.8 % (ref 11.0–15.0)
Total Lymphocyte: 21 %
WBC: 6.9 Thousand/uL (ref 3.8–10.8)

## 2024-06-07 LAB — COMPREHENSIVE METABOLIC PANEL WITH GFR
AG Ratio: 1.6 (calc) (ref 1.0–2.5)
ALT: 17 U/L (ref 6–29)
AST: 12 U/L (ref 10–35)
Albumin: 4.5 g/dL (ref 3.6–5.1)
Alkaline phosphatase (APISO): 46 U/L (ref 37–153)
BUN: 15 mg/dL (ref 7–25)
CO2: 26 mmol/L (ref 20–32)
Calcium: 9.6 mg/dL (ref 8.6–10.4)
Chloride: 105 mmol/L (ref 98–110)
Creat: 0.62 mg/dL (ref 0.50–1.03)
Globulin: 2.9 g/dL (ref 1.9–3.7)
Glucose, Bld: 125 mg/dL — ABNORMAL HIGH (ref 65–99)
Potassium: 4.1 mmol/L (ref 3.5–5.3)
Sodium: 139 mmol/L (ref 135–146)
Total Bilirubin: 0.4 mg/dL (ref 0.2–1.2)
Total Protein: 7.4 g/dL (ref 6.1–8.1)
eGFR: 108 mL/min/1.73m2 (ref >=60)

## 2024-06-07 LAB — AMYLASE: Amylase: 22 U/L (ref 21–101)

## 2024-06-07 LAB — LIPASE: Lipase: 32 U/L (ref 7–60)

## 2024-06-07 MED ORDER — CYCLOBENZAPRINE HCL 10 MG PO TABS
10.0000 mg | ORAL_TABLET | Freq: Three times a day (TID) | ORAL | 0 refills | Status: DC | PRN
Start: 2024-06-07 — End: 2024-07-31

## 2024-06-07 NOTE — Progress Notes (Signed)
 Subjective:    Patient ID: Mary Conley, female    DOB: 13-May-1971, 53 y.o.   MRN: [id number]  Patient was seen in the emergency room in April and was found to have a mildly elevated lipase of 55.  Recently went to her endocrinologist who repeated a lipase and found to be greater than 200.  Patient reports intermittent abdominal pain that is lower in nature.  She denies any alcohol use.  She has no history of hypertriglyceridemia.  She has no history of hypercalcemia.  She is not currently taking Mounjaro  or Ozempic.  Past Surgical History:  Procedure Laterality Date   CESAREAN SECTION     Current Outpatient Medications on File Prior to Visit  Medication Sig Dispense Refill   amoxicillin -clavulanate (AUGMENTIN ) 875-125 MG tablet Take 1 tablet by mouth 2 (two) times daily.     dicyclomine  (BENTYL ) 10 MG capsule Take 1 capsule (10 mg total) by mouth 4 (four) times daily -  before meals and at bedtime. As needed for abdominal cramps 30 capsule 1   fluconazole  (DIFLUCAN ) 200 MG tablet Take 1 tablet (200 mg total) by mouth daily as needed for up to 1 dose. (Patient not taking: Reported on 02/12/2024) 1 tablet 0   No current facility-administered medications on file prior to visit.   Allergies  Allergen Reactions   Prednisone  Other (See Comments)    Flushing   Social History   Socioeconomic History   Marital status: Married    Spouse name: Not on file   Number of children: 2   Years of education: Not on file   Highest education level: Not on file  Occupational History   Occupation: Runner, broadcasting/film/video / coach  Tobacco Use   Smoking status: Never   Smokeless tobacco: Never  Substance and Sexual Activity   Alcohol use: No   Drug use: Not on file   Sexual activity: Not on file  Other Topics Concern   Not on file  Social History Narrative   Not on file   Social Drivers of Health   Financial Resource Strain: Not on file  Food Insecurity: Not on file  Transportation Needs: Not on file   Physical Activity: Not on file  Stress: Not on file  Social Connections: Not on file  Intimate Partner Violence: Not on file   Family History  Problem Relation Age of Onset   Heart attack Other    Hypertension Other    Diabetes Other       Review of Systems  Gastrointestinal:  Positive for abdominal pain.  All other systems reviewed and are negative.      Objective:   Physical Exam Vitals reviewed.  Constitutional:      General: She is not in acute distress.    Appearance: Normal appearance. She is obese. She is not ill-appearing, toxic-appearing or diaphoretic.  HENT:     Head: Normocephalic and atraumatic.  Neck:     Vascular: No carotid bruit.  Cardiovascular:     Rate and Rhythm: Normal rate and regular rhythm.     Pulses: Normal pulses.     Heart sounds: Normal heart sounds. No murmur heard.    No friction rub. No gallop.  Pulmonary:     Effort: Pulmonary effort is normal. No respiratory distress.     Breath sounds: Normal breath sounds. No stridor. No wheezing, rhonchi or rales.  Abdominal:     General: Bowel sounds are normal. There is no distension.     Palpations:  Abdomen is soft.     Tenderness: There is no abdominal tenderness. There is no guarding or rebound.  Musculoskeletal:     Cervical back: Normal range of motion. No rigidity.  Lymphadenopathy:     Cervical: No cervical adenopathy.  Skin:    General: Skin is warm.     Coloration: Skin is not jaundiced or pale.     Findings: No bruising, erythema, lesion or rash.  Neurological:     General: No focal deficit present.     Mental Status: She is alert and oriented to person, place, and time.     Cranial Nerves: No cranial nerve deficit.     Motor: No weakness.     Coordination: Coordination normal.     Gait: Gait normal.  Psychiatric:        Mood and Affect: Mood normal.        Behavior: Behavior normal.        Thought Content: Thought content normal.        Judgment: Judgment normal.            Elevated lipase - Plan: CBC with Differential/Platelet, Comprehensive metabolic panel with GFR, Lipase, Amylase repeat CBC CMP lipase and amylase.  If lipase is persistently significantly elevated I would recommend a CT scan of the abdomen and pelvis to evaluate further.  Bilirubin and liver function test are also elevated I would recommend an MRCP.

## 2024-06-10 ENCOUNTER — Encounter: Payer: Self-pay | Admitting: Family Medicine

## 2024-06-10 ENCOUNTER — Ambulatory Visit: Payer: Self-pay | Admitting: Family Medicine

## 2024-07-22 ENCOUNTER — Other Ambulatory Visit

## 2024-07-25 ENCOUNTER — Telehealth: Payer: Self-pay

## 2024-07-25 NOTE — Telephone Encounter (Signed)
 Copied from CRM [id number]. Topic: General - Other >> Jul 25, 2024  3:46 PM Antony S wrote: Reason for CRM: pt said she was suppose to recheck her blood in 2 weeks but theres no orders, she's wanting to know if she will get her blood rechecked

## 2024-07-29 ENCOUNTER — Telehealth: Payer: Self-pay

## 2024-07-29 NOTE — Telephone Encounter (Signed)
 Pt is coming in tomorrow morning for repeat labs. She has a GI appt on Wednesday. Does she need a repeat Amylase and Lipase? Thanks.

## 2024-07-29 NOTE — Progress Notes (Signed)
 [facility] Initial Consultation   Referring Provider Tommas Pears, MD [address] [place],  [place] [address]  Primary Care Provider Duanne Butler DASEN, MD  Patient Profile: Mary Conley is a 53 y.o. female who is seen in consultation in the [facility] at the request of Dr. Balan for evaluation and management of the problem(s) noted below.  Problem List: Elevated lipase Hepatic steatosis Acute uncomplicated diverticulitis on CT imaging 12/2023 Colorectal cancer screening   History of Present Illness     Discussed the use of AI scribe software for clinical note transcription with the patient, who gave verbal consent to proceed.  History of Present Illness Mary Conley is a 53 year old woman with a past medical history noteworthy for T2DM, anemia, vitamin D  deficiency, morbid obesity, HLD, neuropathy who is referred to the gastroenterology office for evaluation of elevated lipase, change in bowel habits, recent acute uncomplicated diverticulitis hepatic steatosis and colorectal cancer screening  Elevated lipase - Seen in ED 12/2023 when diagnosed with diverticulitis by CTAP - Lipase at that time minimally elevated at 55 -no pancreatic abnormalities on CTAP - Was on Mounjaro  at that time and advised to discontinue treatment - Lipase has been monitored over time with intermittent elevations: 79, 235, 32, 83 - Denies epigastric abdominal pain, nausea, vomiting - No family history of pancreatitis or pancreatic cancer  Gastrointestinal symptoms - Urgency and incontinence after eating greasy foods - Sensation of a 'weak' stomach and gurgling, especially at night - Intermittent lower abdominal pain, more frequent than one year ago - Increase in loose stools compared to the past five years, though bowel movements are generally normal - No sharp upper abdominal pain, nausea, or vomiting since April 2025 - Occasional morning nausea, which  resolves with eating  History of acute uncomplicated diverticulitis - Acute severe nausea, vomiting, abdominal pain 12/2023 - CT scan in April 2025 indicated uncomplicated diverticulitis - Treated with antibiotics for diverticulitis with resolution of acute episode - Notes mild bilateral lower abdominal discomfort over the last year - No recent colonoscopy - Negative Cologuard 2023 - No family history of colorectal cancer - Discussed performing updated colonoscopy and patient is agreeable  Hepatic steatosis - CT scan in April 2025 indicated fatty liver, a new finding - LFTs normal - No regular alcohol consumption - No history of hepatitis, jaundice, blood transfusion or intravenous drug use - No family history of liver disease - Discussed likelihood of MASLD - Fib 4 = 0.58 (low risk of fibrosis)       GI Review of Symptoms Significant for  diarrhea, fecal urgency. Otherwise negative.  General Review of Systems  Review of systems is significant for the pertinent positives and negatives as listed per the HPI.  Full ROS is otherwise negative.  Past Medical History   Past Medical History:  Diagnosis Date   Chest pain    Diabetes mellitus without complication (HCC)    Diverticulitis    GERD (gastroesophageal reflux disease)    Hypercholesteremia      Past Surgical History   Past Surgical History:  Procedure Laterality Date   CESAREAN SECTION       Allergies and Medications   Allergies  Allergen Reactions   Prednisone  Other (See Comments)    Flushing    Current Meds  Medication Sig   [EXPIRED] Na Sulfate-K Sulfate-Mg Sulfate concentrate (SUPREP) 17.5-3.13-1.6 GM/177ML SOLN Take 1 kit (354 mLs total) by mouth once for 1 dose.   XIGDUO XR 06-999 MG TB24 Take  1 tablet by mouth daily.     Family History   Family History  Problem Relation Age of Onset   Hypertension Mother    Irritable bowel syndrome Mother    Diabetes Father    Heart disease Father     Diabetes Maternal Grandmother    Heart attack Other    Hypertension Other    Diabetes Other      Social History   Social History   Tobacco Use   Smoking status: Never   Smokeless tobacco: Never  Vaping Use   Vaping status: Never Used  Substance Use Topics   Alcohol use: No   Drug use: Never   Cheryle reports that she has never smoked. She has never used smokeless tobacco. She reports that she does not drink alcohol and does not use drugs.  Vital Signs and Physical Examination   Vitals:   07/31/24 0907  BP: 110/72  Pulse: 96   Body mass index is 38.88 kg/m. Weight: 219 lb 8 oz (99.6 kg)  General: Well developed, well nourished, no acute distress Head: Normocephalic and atraumatic Eyes: Sclerae anicteric, EOMI Lungs: Clear throughout to auscultation Heart: Regular rate and rhythm; No murmurs, rubs or bruits Abdomen: Soft, non tender and non distended. No masses, hepatosplenomegaly or hernias noted. Normal Bowel sounds Rectal: Deferred Musculoskeletal: Symmetrical with no gross deformities    Review of Data  The following data was reviewed at the time of this encounter:  Laboratory Studies      Latest Ref Rng & Units 07/30/2024    8:16 AM 06/07/2024    9:36 AM 02/12/2024   11:43 AM  CBC  WBC 3.8 - 10.8 Thousand/uL 6.9  6.9  7.7   Hemoglobin 11.7 - 15.5 g/dL 86.7  86.7  86.6   Hematocrit 35.0 - 45.0 % 40.7  40.2  40.4   Platelets 140 - 400 Thousand/uL 267  258  277     Lab Results  Component Value Date   LIPASE 83 (H) 07/30/2024      Latest Ref Rng & Units 07/30/2024    8:16 AM 06/07/2024    9:36 AM 02/12/2024   11:43 AM  CMP  Glucose 65 - 99 mg/dL 865  874  868   BUN 7 - 25 mg/dL 13  15  11    Creatinine 0.50 - 1.03 mg/dL 9.37  9.37  9.38   Sodium 135 - 146 mmol/L 139  139  138   Potassium 3.5 - 5.3 mmol/L 4.2  4.1  4.5   Chloride 98 - 110 mmol/L 105  105  103   CO2 20 - 32 mmol/L 28  26  30    Calcium  8.6 - 10.4 mg/dL 9.3  9.6  9.7   Total Protein 6.1  - 8.1 g/dL 7.2  7.4  7.2   Total Bilirubin 0.2 - 1.2 mg/dL 0.5  0.4  0.4   AST 10 - 35 U/L 14  12  15    ALT 6 - 29 U/L 19  17  21     Cologuard negative 2023  Imaging Studies  CTAP 01/19/2024 1. Acute descending colon diverticulitis. No diverticular abscess or perforation. 2. Fatty liver. 3. Aortic Atherosclerosis (ICD10-I70.0).    GI Procedures and Studies  None   Clinical Impression  It is my clinical impression that Ms. Fanguy is a 53 y.o. female with;  Elevated lipase Change in bowel habits Acute uncomplicated diverticulitis on CT imaging 12/2023 Hepatic steatosis Colorectal cancer screening  Casy presents to  the office for evaluation of multiple gastrointestinal issues as outlined above.  With regard to her history of elevated lipase, she has not had symptoms of significant abdominal pain since last spring nor did previous CT imaging show abnormalities of her pancreas.  We discussed other benign etiologies that can cause intermittent transient hyper lipase EMEA such as celiac disease, peptic ulcer disease.  Given that there has been ongoing fluctuation I recommended performing an MRI/MRCP which will provide an opportunity to better evaluate her pancreas and bile ducts.  When she was seen in the emergency department last spring during an episode of abdominal pain, nausea and vomiting she was diagnosed with her first episode of uncomplicated diverticulitis.  Since that time she has had some changes in her bowels with intermittent loose stool.  We discussed further evaluation with laboratory testing and performing a colonoscopy since she has not had a colonoscopy in the past.  This will also provide an opportunity for colorectal cancer screening.  In terms of her finding of hepatic steatosis on imaging for 2025, suspect that this reflects MASLD.  LFTs are normal, she does not have risk factors for other forms of chronic liver disease and fib 4 suggests low risk of fibrosis.  Will  continue to monitor liver enzymes and periodic liver imaging at this time.  Plan  Schedule MRI MRCP follow-up intermittent elevations of lipase Schedule colonoscopy at [facility] to follow-up history of acute diverticulitis and colorectal cancer screening Recommend augmentation of fiber in diet given change in bowel habits and more frequent stools Monitor LFTs every 6 to 12 months and liver imaging every 2 to 3 years Advise maintaining healthy weight, diet and exercise  Planned Follow Up 3 months  The patient or caregiver verbalized understanding of the material covered, with no barriers to understanding. All questions were answered. Patient or caregiver is agreeable with the plan outlined above.    It was a pleasure to see Crisol.  If you have any questions or concerns regarding this evaluation, do not hesitate to contact me.  Inocente Hausen, MD [facility]   I spent total of 45 minutes in both face-to-face (25 minutes interview) and non-face-to-face (20 minutes chart review, care coordination, documentation)  activities, excluding procedures performed, for the visit on the date of this encounter.

## 2024-07-29 NOTE — Addendum Note (Signed)
 Addended by: ANGELENA RONAL BRADLEY K on: 07/29/2024 04:23 PM   Modules accepted: Orders

## 2024-07-30 ENCOUNTER — Other Ambulatory Visit

## 2024-07-30 DIAGNOSIS — R748 Abnormal levels of other serum enzymes: Secondary | ICD-10-CM

## 2024-07-30 LAB — CBC WITH DIFFERENTIAL/PLATELET
Absolute Lymphocytes: 1477 {cells}/uL (ref 850–3900)
Absolute Monocytes: 435 {cells}/uL (ref 200–950)
Basophils Absolute: 28 {cells}/uL (ref 0–200)
Basophils Relative: 0.4 %
Eosinophils Absolute: 90 {cells}/uL (ref 15–500)
Eosinophils Relative: 1.3 %
HCT: 40.7 % (ref 35.0–45.0)
Hemoglobin: 13.2 g/dL (ref 11.7–15.5)
MCH: 28.2 pg (ref 27.0–33.0)
MCHC: 32.4 g/dL (ref 32.0–36.0)
MCV: 87 fL (ref 80.0–100.0)
MPV: 9.9 fL (ref 7.5–12.5)
Monocytes Relative: 6.3 %
Neutro Abs: 4871 {cells}/uL (ref 1500–7800)
Neutrophils Relative %: 70.6 %
Platelets: 267 Thousand/uL (ref 140–400)
RBC: 4.68 Million/uL (ref 3.80–5.10)
RDW: 13 % (ref 11.0–15.0)
Total Lymphocyte: 21.4 %
WBC: 6.9 Thousand/uL (ref 3.8–10.8)

## 2024-07-30 LAB — COMPREHENSIVE METABOLIC PANEL WITH GFR
AG Ratio: 1.6 (calc) (ref 1.0–2.5)
ALT: 19 U/L (ref 6–29)
AST: 14 U/L (ref 10–35)
Albumin: 4.4 g/dL (ref 3.6–5.1)
Alkaline phosphatase (APISO): 40 U/L (ref 37–153)
BUN: 13 mg/dL (ref 7–25)
CO2: 28 mmol/L (ref 20–32)
Calcium: 9.3 mg/dL (ref 8.6–10.4)
Chloride: 105 mmol/L (ref 98–110)
Creat: 0.62 mg/dL (ref 0.50–1.03)
Globulin: 2.8 g/dL (ref 1.9–3.7)
Glucose, Bld: 134 mg/dL — ABNORMAL HIGH (ref 65–99)
Potassium: 4.2 mmol/L (ref 3.5–5.3)
Sodium: 139 mmol/L (ref 135–146)
Total Bilirubin: 0.5 mg/dL (ref 0.2–1.2)
Total Protein: 7.2 g/dL (ref 6.1–8.1)
eGFR: 108 mL/min/1.73m2 (ref >=60)

## 2024-07-30 LAB — AMYLASE: Amylase: 36 U/L (ref 21–101)

## 2024-07-30 LAB — LIPASE: Lipase: 83 U/L — ABNORMAL HIGH (ref 7–60)

## 2024-07-31 ENCOUNTER — Ambulatory Visit: Admitting: Pediatrics

## 2024-07-31 ENCOUNTER — Encounter: Payer: Self-pay | Admitting: Pediatrics

## 2024-07-31 VITALS — BP 110/72 | HR 96 | Ht 63.0 in | Wt 219.5 lb

## 2024-07-31 DIAGNOSIS — R194 Change in bowel habit: Secondary | ICD-10-CM

## 2024-07-31 DIAGNOSIS — K760 Fatty (change of) liver, not elsewhere classified: Secondary | ICD-10-CM

## 2024-07-31 DIAGNOSIS — K5732 Diverticulitis of large intestine without perforation or abscess without bleeding: Secondary | ICD-10-CM

## 2024-07-31 DIAGNOSIS — R748 Abnormal levels of other serum enzymes: Secondary | ICD-10-CM

## 2024-07-31 DIAGNOSIS — Z1211 Encounter for screening for malignant neoplasm of colon: Secondary | ICD-10-CM

## 2024-07-31 MED ORDER — NA SULFATE-K SULFATE-MG SULF 17.5-3.13-1.6 GM/177ML PO SOLN: 1.0000 | Freq: Once | ORAL | 0 refills | Status: AC

## 2024-07-31 NOTE — Patient Instructions (Signed)
 You have been scheduled for an MRCP at [facility] (1st Floor) on Friday, 08/09/24. Your appointment time is 12:00 pm. Please arrive to admitting (at main entrance of the hospital) 30 minutes prior to your appointment time for registration purposes. Please make certain not to have anything to eat or drink 6 hours prior to your test. In addition, if you have any metal in your body, have a pacemaker or defibrillator, please be sure to let your ordering physician know. This test typically takes 45 minutes to 1 hour to complete. Should you need to reschedule, please call [phone] to do so.  You have been scheduled for a colonoscopy on 08/07/24 at 7:00 am. Please follow written instructions given to you at your visit today.   If you use inhalers (even only as needed), please bring them with you on the day of your procedure.  DO NOT TAKE 7 DAYS PRIOR TO TEST- Trulicity  (dulaglutide ) Ozempic, Wegovy (semaglutide) Mounjaro  (tirzepatide ) Bydureon Bcise (exanatide extended release)  DO NOT TAKE 1 DAY PRIOR TO YOUR TEST Rybelsus (semaglutide) Adlyxin (lixisenatide) Victoza (liraglutide) Byetta (exanatide) ___________________________________________________________________________   Follow up in 3 months or sooner if needed.  Thank you for entrusting me with your care and for choosing [facility], Dr. Inocente Hausen  _______________________________________________________  If your blood pressure at your visit was 140/90 or greater, please contact your primary care physician to follow up on this.  _______________________________________________________  If you are age 42 or older, your body mass index should be between 23-30. Your Body mass index is 38.88 kg/m. If this is out of the aforementioned range listed, please consider follow up with your Primary Care Provider.  If you are age 40 or younger, your body mass index should be between 19-25. Your Body mass index is 38.88  kg/m. If this is out of the aformentioned range listed, please consider follow up with your Primary Care Provider.   ________________________________________________________  The [facility] providers would like to encourage you to use MYCHART to communicate with providers for non-urgent requests or questions.  Due to long hold times on the telephone, sending your provider a message by [facility] may be a faster and more efficient way to get a response.  Please allow 48 business hours for a response.  Please remember that this is for non-urgent requests.  _______________________________________________________  [facility] is using a team-based approach to care.  Your team is made up of your doctor and two to three APPS. Our APPS (Nurse Practitioners and Physician Assistants) work with your physician to ensure care continuity for you. They are fully qualified to address your health concerns and develop a treatment plan. They communicate directly with your gastroenterologist to care for you. Seeing the Advanced Practice Practitioners on your physician's team can help you by facilitating care more promptly, often allowing for earlier appointments, access to diagnostic testing, procedures, and other specialty referrals.

## 2024-08-01 ENCOUNTER — Ambulatory Visit: Payer: Self-pay | Admitting: Family Medicine

## 2024-08-01 ENCOUNTER — Other Ambulatory Visit: Payer: Self-pay | Admitting: Family Medicine

## 2024-08-01 DIAGNOSIS — K859 Acute pancreatitis without necrosis or infection, unspecified: Secondary | ICD-10-CM

## 2024-08-05 NOTE — Progress Notes (Unsigned)
 [facility] History and Physical   Primary Care Physician:  Duanne Butler DASEN, MD   Reason for Procedure:  Follow-up of acute uncomplicated diverticulitis, change in bowel habits  Plan:    Colonoscopy   The patient was provided an opportunity to ask questions and all were answered. The patient agreed with the plan.   HPI: Mary Conley is a 53 y.o. female undergoing colonoscopy for  recent history of diverticulitis and change in bowel habits.  Makhayla has never had a screening colonoscopy.  Cologuard negative 2023.  No family history of colorectal cancer or polyps.  She was diagnosed with acute uncomplicated diverticulitis in April 2025 treated with antibiotics with resolution of acute episode.  Since that time she has had mild lower abdominal discomfort and intermittent loose stools.     Past Medical History:  Diagnosis Date   Chest pain    Diabetes mellitus without complication (HCC)    Diverticulitis    GERD (gastroesophageal reflux disease)    Hypercholesteremia     Past Surgical History:  Procedure Laterality Date   CESAREAN SECTION      Prior to Admission medications   Medication Sig Start Date End Date Taking? Authorizing Provider  XIGDUO XR 06-999 MG TB24 Take 1 tablet by mouth daily. 02/28/24   [provider]    Current Outpatient Medications  Medication Sig Dispense Refill   LORazepam (ATIVAN) 1 MG tablet Take lorazepam 1 mg by mouth 1 hour prior to MRI.  May take additional dose of lorazepam 1 mg by mouth immediately prior to MRI if still experiencing anxiety/claustrophobia. (Patient not taking: Reported on 08/07/2024) 2 tablet 0   XIGDUO XR 06-999 MG TB24 Take 1 tablet by mouth daily.     Current Facility-Administered Medications  Medication Dose Route Frequency Provider Last Rate Last Admin   0.9 %  sodium chloride infusion  500 mL Intravenous Once Constance Whittle, Inocente HERO, MD        Allergies as of 08/07/2024 - Review Complete 08/07/2024   Allergen Reaction Noted   Prednisone  Other (See Comments) 05/08/2018    Family History  Problem Relation Age of Onset   Hypertension Mother    Irritable bowel syndrome Mother    Diabetes Father    Heart disease Father    Diabetes Maternal Grandmother    Heart attack Other    Hypertension Other    Diabetes Other    Colon cancer Neg Hx    Esophageal cancer Neg Hx    Stomach cancer Neg Hx     Social History   Socioeconomic History   Marital status: Married    Spouse name: Not on file   Number of children: 2   Years of education: Not on file   Highest education level: Not on file  Occupational History   Occupation: runner, broadcasting/film/video / psychologist, occupational  Tobacco Use   Smoking status: Never   Smokeless tobacco: Never  Vaping Use   Vaping status: Never Used  Substance and Sexual Activity   Alcohol use: No   Drug use: Never   Sexual activity: Not on file  Other Topics Concern   Not on file  Social History Narrative   Not on file   Social Drivers of Health   Financial Resource Strain: Not on file  Food Insecurity: Not on file  Transportation Needs: Not on file  Physical Activity: Not on file  Stress: Not on file  Social Connections: Not on file  Intimate Partner Violence: Not on file  Review of Systems:  All other review of systems negative except as mentioned in the HPI.  Physical Exam: Vital signs BP 120/77   Pulse 73   Temp (!) 97.3 F (36.3 C)   Ht 5' 3 (1.6 m)   Wt 219 lb (99.3 kg)   SpO2 98%   BMI 38.79 kg/m   General:   Alert,  Well-developed, well-nourished, pleasant and cooperative in NAD Airway:  Mallampati 2 Lungs:  Clear throughout to auscultation.   Heart:  Regular rate and rhythm; no murmurs, clicks, rubs,  or gallops. Abdomen:  Soft, nontender and nondistended. Normal bowel sounds.   Neuro/Psych:  Normal mood and affect. A and O x 3  Inocente Hausen, MD [facility]

## 2024-08-06 ENCOUNTER — Encounter: Payer: Self-pay | Admitting: Pediatrics

## 2024-08-06 MED ORDER — LORAZEPAM 1 MG PO TABS
ORAL_TABLET | ORAL | 0 refills | Status: AC
Start: 2024-08-06 — End: ?

## 2024-08-07 ENCOUNTER — Ambulatory Visit: Admitting: Pediatrics

## 2024-08-07 ENCOUNTER — Encounter: Payer: Self-pay | Admitting: Pediatrics

## 2024-08-07 VITALS — BP 106/67 | HR 58 | Temp 97.3°F | Resp 14 | Ht 63.0 in | Wt 219.0 lb

## 2024-08-07 DIAGNOSIS — D123 Benign neoplasm of transverse colon: Secondary | ICD-10-CM | POA: Diagnosis not present

## 2024-08-07 DIAGNOSIS — K648 Other hemorrhoids: Secondary | ICD-10-CM | POA: Diagnosis not present

## 2024-08-07 DIAGNOSIS — R194 Change in bowel habit: Secondary | ICD-10-CM | POA: Diagnosis present

## 2024-08-07 DIAGNOSIS — K635 Polyp of colon: Secondary | ICD-10-CM | POA: Diagnosis not present

## 2024-08-07 DIAGNOSIS — Z1211 Encounter for screening for malignant neoplasm of colon: Secondary | ICD-10-CM

## 2024-08-07 MED ORDER — SODIUM CHLORIDE 0.9 % IV SOLN: 500.0000 mL | Freq: Once | INTRAVENOUS | Status: DC

## 2024-08-07 NOTE — Op Note (Signed)
 [facility] Patient Name: Mary Conley Procedure Date: 08/07/2024 7:13 AM MRN: [id number] Endoscopist: Inocente Hausen , MD, [id number] Age: 53 Referring MD:  Date of Birth: [birth year] Gender: Female Account #: [id number] Procedure:                Colonoscopy Indications:              This is the patient's first colonoscopy, Follow-up                            of diverticulitis, Lower abdominal pain, Change in                            bowel habits Medicines:                Monitored Anesthesia Care Procedure:                Pre-Anesthesia Assessment:                           - Prior to the procedure, a History and Physical                            was performed, and patient medications and                            allergies were reviewed. The patient's tolerance of                            previous anesthesia was also reviewed. The risks                            and benefits of the procedure and the sedation                            options and risks were discussed with the patient.                            All questions were answered, and informed consent                            was obtained. Prior Anticoagulants: The patient has                            taken no anticoagulant or antiplatelet agents. ASA                            Grade Assessment: II - A patient with mild systemic                            disease. After reviewing the risks and benefits,                            the patient was deemed in satisfactory condition to  undergo the procedure.                           After obtaining informed consent, the colonoscope                            was passed under direct vision. Throughout the                            procedure, the patient's blood pressure, pulse, and                            oxygen saturations were monitored continuously. The                            Colonoscope was introduced through the  anus and                            advanced to the cecum, identified by appendiceal                            orifice and ileocecal valve. The colonoscopy was                            performed without difficulty. The patient tolerated                            the procedure well. The quality of the bowel                            preparation was good. The ileocecal valve,                            appendiceal orifice, and rectum were photographed. Scope In: 7:55:20 AM Scope Out: 8:16:16 AM Scope Withdrawal Time: 0 hours 15 minutes 39 seconds  Total Procedure Duration: 0 hours 20 minutes 56 seconds  Findings:                 The perianal and digital rectal examinations were                            normal. Pertinent negatives include normal                            sphincter tone and no palpable rectal lesions.                           Multiple small-mouthed diverticula were found in                            the sigmoid colon, descending colon, transverse                            colon and ascending colon.  A 5 mm polyp was found in the transverse colon. The                            polyp was sessile. The polyp was removed with a                            cold snare. Resection and retrieval were complete.                           A 4 mm polyp was found in the hepatic flexure. The                            polyp was sessile. The polyp was removed with a                            cold biopsy forceps. Resection and retrieval were                            complete.                           Normal mucosa was found in the entire colon.                            Biopsies for histology were taken with a cold                            forceps for evaluation of microscopic colitis.                           Internal hemorrhoids were found during                            retroflexion. Retroflexion photo was taken but did                             not capture in Provation. Complications:            No immediate complications. Estimated blood loss:                            Minimal. Estimated Blood Loss:     Estimated blood loss was minimal. Impression:               - Diverticulosis in the sigmoid colon, in the                            descending colon, in the transverse colon and in                            the ascending colon.                           - One 5 mm polyp in the transverse colon, removed  with a cold snare. Resected and retrieved.                           - One 4 mm polyp at the hepatic flexure, removed                            with a cold biopsy forceps. Resected and retrieved.                           - Normal mucosa in the entire examined colon.                            Biopsied.                           - Internal hemorrhoids. Recommendation:           - Discharge patient to home (ambulatory).                           - Await pathology results.                           - Repeat colonoscopy for surveillance based on                            pathology results.                           - The findings and recommendations were discussed                            with the patient's family.                           - Patient has a contact number available for                            emergencies. The signs and symptoms of potential                            delayed complications were discussed with the                            patient. Return to normal activities tomorrow.                            Written discharge instructions were provided to the                            patient. Inocente Hausen, MD 08/07/2024 8:24:32 AM This report has been signed electronically.

## 2024-08-07 NOTE — Progress Notes (Signed)
 Called to room to assist during endoscopic procedure.  Patient ID and intended procedure confirmed with present staff. Received instructions for my participation in the procedure from the performing physician.

## 2024-08-07 NOTE — Patient Instructions (Signed)

## 2024-08-07 NOTE — Progress Notes (Signed)
 Sedate, gd SR, tolerated procedure well, VSS, report to RN

## 2024-08-08 ENCOUNTER — Telehealth: Payer: Self-pay

## 2024-08-08 NOTE — Telephone Encounter (Signed)
  Follow up Call-     08/07/2024    7:23 AM  Call back number  Post procedure Call Back phone  # [phone]  Permission to leave phone message Yes     Patient questions:  Do you have a fever, pain , or abdominal swelling? No. Pain Score  0 *  Have you tolerated food without any problems? Yes.    Have you been able to return to your normal activities? Yes.    Do you have any questions about your discharge instructions: Diet   No. Medications  No. Follow up visit  No.  Do you have questions or concerns about your Care? No.  Actions: * If pain score is 4 or above: No action needed, pain <4.

## 2024-08-09 ENCOUNTER — Ambulatory Visit: Payer: Self-pay | Admitting: Pediatrics

## 2024-08-09 ENCOUNTER — Other Ambulatory Visit (HOSPITAL_COMMUNITY): Payer: Self-pay | Admitting: Pediatrics

## 2024-08-09 ENCOUNTER — Ambulatory Visit (HOSPITAL_COMMUNITY)
Admission: RE | Admit: 2024-08-09 | Discharge: 2024-08-09 | Disposition: A | Discharge status: Home / Self Care | Source: Ambulatory Visit | Attending: Pediatrics | Admitting: Pediatrics

## 2024-08-09 DIAGNOSIS — R52 Pain, unspecified: Secondary | ICD-10-CM | POA: Insufficient documentation

## 2024-08-09 DIAGNOSIS — K5732 Diverticulitis of large intestine without perforation or abscess without bleeding: Secondary | ICD-10-CM | POA: Insufficient documentation

## 2024-08-09 DIAGNOSIS — R748 Abnormal levels of other serum enzymes: Secondary | ICD-10-CM | POA: Insufficient documentation

## 2024-08-09 MED ORDER — GADOBUTROL 1 MMOL/ML IV SOLN
9.0000 mL | Freq: Once | INTRAVENOUS | Status: AC | PRN
  Administered 2024-08-09: 9 mL via INTRAVENOUS

## 2024-08-13 LAB — SURGICAL PATHOLOGY

## 2024-08-14 ENCOUNTER — Ambulatory Visit: Payer: Self-pay | Admitting: Pediatrics

## 2024-09-27 ENCOUNTER — Ambulatory Visit: Payer: Self-pay

## 2024-09-27 ENCOUNTER — Encounter: Payer: Self-pay | Admitting: Family Medicine

## 2024-09-27 ENCOUNTER — Other Ambulatory Visit: Payer: Self-pay | Admitting: Family Medicine

## 2024-09-27 MED ORDER — AMOXICILLIN-POT CLAVULANATE 875-125 MG PO TABS: 1.0000 | ORAL_TABLET | Freq: Two times a day (BID) | ORAL | 0 refills | Status: AC

## 2024-09-27 NOTE — Telephone Encounter (Signed)
 FYI Only or Action Required?: Action required by provider: request for appointment.  Patient was last seen in primary care on 06/07/2024 by Duanne Butler DASEN, MD.  Called Nurse Triage reporting Abdominal Pain.  Symptoms began yesterday.  Interventions attempted: Nothing.  Symptoms are: unchanged.Abdominal pain left lower quadrant. Comes and goes. Pain 7/10. No availability in office today. Appointment made for Monday. States I sent him a My Chart message so I'll see if he answers me. States she will go to UC if symptoms worsen.  Triage Disposition: See Physician Within 24 Hours  Patient/caregiver understands and will follow disposition?: Yes     Copied from CRM #[id number]. Topic: Clinical - Red Word Triage >> Sep 27, 2024  3:07 PM Delon DASEN wrote: Red Word that prompted transfer to Nurse Triage: Pain on left side, possible diverticulitis, pain level 7 Reason for Disposition  [1] MODERATE pain (e.g., interferes with normal activities) AND [2] pain comes and goes (cramps) AND [3] present > 24 hours  (Exception: Pain with Vomiting or Diarrhea - see that Guideline.)  Answer Assessment - Initial Assessment Questions 1. LOCATION: Where does it hurt?      Left lower 2. RADIATION: Does the pain shoot anywhere else? (e.g., chest, back)     no 3. ONSET: When did the pain begin? (e.g., minutes, hours or days ago)      yesterday 4. SUDDEN: Gradual or sudden onset?     gradual 5. PATTERN Does the pain come and go, or is it constant?     Comes and goes 6. SEVERITY: How bad is the pain?  (e.g., Scale 1-10; mild, moderate, or severe)     7 7. RECURRENT SYMPTOM: Have you ever had this type of stomach pain before? If Yes, ask: When was the last time? and What happened that time?      yes 8. CAUSE: What do you think is causing the stomach pain? (e.g., gallstones, recent abdominal surgery)     diverticulitis 9. RELIEVING/AGGRAVATING FACTORS: What makes it better or worse?  (e.g., antacids, bending or twisting motion, bowel movement)     no 10. OTHER SYMPTOMS: Do you have any other symptoms? (e.g., back pain, diarrhea, fever, urination pain, vomiting)       no 11. PREGNANCY: Is there any chance you are pregnant? When was your last menstrual period?       no  Protocols used: Abdominal Pain - [facility]

## 2024-09-30 ENCOUNTER — Ambulatory Visit: Admitting: Family Medicine
